# Patient Record
Sex: Male | Born: 1950 | Race: White | Hispanic: No | Marital: Married | Smoking: Former smoker
Health system: Southern US, Community
[De-identification: ages and names within clinical notes are randomized; demographics above are authoritative.]

## PROBLEM LIST (undated history)

## (undated) DIAGNOSIS — Z9989 Dependence on other enabling machines and devices: Secondary | ICD-10-CM

## (undated) DIAGNOSIS — F909 Attention-deficit hyperactivity disorder, unspecified type: Secondary | ICD-10-CM

## (undated) DIAGNOSIS — Z860101 Personal history of adenomatous and serrated colon polyps: Secondary | ICD-10-CM

## (undated) DIAGNOSIS — E785 Hyperlipidemia, unspecified: Secondary | ICD-10-CM

## (undated) DIAGNOSIS — G4733 Obstructive sleep apnea (adult) (pediatric): Secondary | ICD-10-CM

## (undated) DIAGNOSIS — F419 Anxiety disorder, unspecified: Secondary | ICD-10-CM

## (undated) DIAGNOSIS — Z973 Presence of spectacles and contact lenses: Secondary | ICD-10-CM

## (undated) DIAGNOSIS — C679 Malignant neoplasm of bladder, unspecified: Secondary | ICD-10-CM

## (undated) DIAGNOSIS — Z86010 Personal history of colonic polyps: Secondary | ICD-10-CM

## (undated) DIAGNOSIS — H9190 Unspecified hearing loss, unspecified ear: Secondary | ICD-10-CM

## (undated) DIAGNOSIS — N3281 Overactive bladder: Secondary | ICD-10-CM

## (undated) DIAGNOSIS — E039 Hypothyroidism, unspecified: Secondary | ICD-10-CM

## (undated) DIAGNOSIS — I10 Essential (primary) hypertension: Secondary | ICD-10-CM

## (undated) DIAGNOSIS — U071 COVID-19: Secondary | ICD-10-CM

## (undated) DIAGNOSIS — N401 Enlarged prostate with lower urinary tract symptoms: Secondary | ICD-10-CM

## (undated) HISTORY — DX: Hyperlipidemia, unspecified: E78.5

## (undated) HISTORY — PX: KNEE ARTHROSCOPY: SHX127

## (undated) HISTORY — PX: LUMBAR MICRODISCECTOMY: SHX99

## (undated) HISTORY — PX: APPENDECTOMY: SHX54

## (undated) HISTORY — PX: RHINOPLASTY: SUR1284

---

## 1998-10-12 ENCOUNTER — Inpatient Hospital Stay (HOSPITAL_COMMUNITY): Admission: EM | Admit: 1998-10-12 | Discharge: 1998-10-13 | Payer: Self-pay | Admitting: Emergency Medicine

## 1998-10-12 ENCOUNTER — Encounter: Payer: Self-pay | Admitting: Emergency Medicine

## 1999-11-22 ENCOUNTER — Ambulatory Visit (HOSPITAL_COMMUNITY): Admission: RE | Admit: 1999-11-22 | Discharge: 1999-11-22 | Payer: Self-pay | Admitting: Gastroenterology

## 1999-11-26 ENCOUNTER — Ambulatory Visit (HOSPITAL_COMMUNITY): Admission: RE | Admit: 1999-11-26 | Discharge: 1999-11-26 | Payer: Self-pay | Admitting: Gastroenterology

## 1999-12-24 ENCOUNTER — Ambulatory Visit (HOSPITAL_COMMUNITY): Admission: RE | Admit: 1999-12-24 | Discharge: 1999-12-24 | Payer: Self-pay | Admitting: Gastroenterology

## 2000-06-03 ENCOUNTER — Ambulatory Visit (HOSPITAL_COMMUNITY): Admission: RE | Admit: 2000-06-03 | Discharge: 2000-06-04 | Payer: Self-pay | Admitting: Specialist

## 2000-06-03 ENCOUNTER — Encounter: Payer: Self-pay | Admitting: Specialist

## 2000-06-03 ENCOUNTER — Encounter (INDEPENDENT_AMBULATORY_CARE_PROVIDER_SITE_OTHER): Payer: Self-pay | Admitting: *Deleted

## 2001-07-12 ENCOUNTER — Encounter (INDEPENDENT_AMBULATORY_CARE_PROVIDER_SITE_OTHER): Payer: Self-pay

## 2001-07-12 ENCOUNTER — Ambulatory Visit (HOSPITAL_COMMUNITY): Admission: RE | Admit: 2001-07-12 | Discharge: 2001-07-12 | Payer: Self-pay | Admitting: Gastroenterology

## 2002-12-09 ENCOUNTER — Observation Stay (HOSPITAL_COMMUNITY): Admission: RE | Admit: 2002-12-09 | Discharge: 2002-12-10 | Payer: Self-pay | Admitting: Specialist

## 2002-12-09 ENCOUNTER — Encounter (INDEPENDENT_AMBULATORY_CARE_PROVIDER_SITE_OTHER): Payer: Self-pay | Admitting: *Deleted

## 2002-12-09 ENCOUNTER — Encounter: Payer: Self-pay | Admitting: Specialist

## 2009-11-05 ENCOUNTER — Encounter: Admission: RE | Admit: 2009-11-05 | Discharge: 2009-11-05 | Payer: Self-pay | Admitting: General Surgery

## 2009-11-13 ENCOUNTER — Encounter: Admission: RE | Admit: 2009-11-13 | Discharge: 2009-11-13 | Payer: Self-pay | Admitting: General Surgery

## 2011-04-25 NOTE — Op Note (Signed)
. [facility]  Patient:    [name], [name]                      MRN: [id number] Proc. Date: 06/03/00 Adm. Date:  [id number] Disc. Date: [id number] Attending:  Pierce Crane                           Operative Report  PREOPERATIVE DIAGNOSES:  Herniated nucleus pulposus, spinal stenosis, L4-5, left.  POSTOPERATIVE DIAGNOSES:  Herniated nucleus pulposus, spinal stenosis, L4-5, left.  PROCEDURE PERFORMED:  Microdiskectomy, L4-5, left; partial median hemifacetectomy and foraminotomy, L5, left.  ANESTHESIA:  General.  SURGEON:  Javier Docker, M.D.  ASSISTANT:  Georges Lynch. Gioffre, M.D.  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  Patient is noted to have a sacralized lumbar vertebral body and lateral recess stenosis compressing the 5 root.  Patient had persistent positive neural tension sign with diminished sensation in the L5 dermatome and weakness in the EHL.  Operative intervention is indicated for decompression of the lateral recess and diskectomy.  Risks and benefits were discussed including wound bleeding and infection, damage to neurovascular structures, recurrent disk herniation, epidural fibrosis, CSF leakage, and need for fusion in the future, etc.  TECHNIQUE:  With patient supine position, after induction of adequate general anesthesia and Kefzol 1 g IV for antibiotic prophylaxis, patient was placed prone on the Bolton frame.  All bony prominences were well-padded.  Lumbar region was prepped and draped in the usual sterile fashion.  Two 18-gauge spinal needles were utilized to localize the 4-5 interspace; this was confirmed with x-ray.  Incision was made from the spinous process of 4 to 5 through the skin, after infiltration of 0.25% Marcaine with epinephrine. Dorsolumbar fascia was identified and divided in line of the skin incision. Paraspinous muscles were  elevated from the laminae of 4 and 5 on the left with a Cobb elevator.  McCullough retractors were placed.  A Penfield 4 was placed in the interlaminar space of 4-5 and x-ray confirmed the 4-5 space. High-speed bur was then utilized to perform a partial medial hemifacetectomy and laminotomies at L4 and L5.  Ligamentum flavum was removed from the interspace, sparing the neural elements at all times.  Severe lateral recess stenosis was noted with compression of the 5 root.  Operating microscope was draped and brought into the surgical field.  Then the axilla of the nerve root was noted.  Extruded fragment of the HNP was gently mobilized with a nerve hook and then removed with a micropituitary.  Foraminotomy of L5 was performed prior to this.  Next, the nerve was gently mobilized medially and disk space was identified and confirmed with x-ray.  Annulotomy was performed and copious portions of disk material were removed from the interspace.  It was noted to be degenerated.  The nerve root was found to be erythematous and edematous. Disk material was further mobilized with an Epstein.  Endplates were not curetted.  Disk material was removed from the interspace.  There was no residual disk material noted.  Hockey-stick probe was placed beneath the thecal sac, out the foramen of 4 and of 5 and found to have no residual disk material.  There was no residual disk material in the axilla and the nerve root of 5 was free and fully mobile.  There was no CSF leakage or epidural  fibrosis.  The disk space was copiously irrigated.  Next, after strict hemostasis, 2 cc of Fentanyl were placed in the epidural space.  McCullough retractor was then removed and paraspinous muscles inspected and no evidence of active bleeding.  Dorsolumbar fascia was reapproximated with #1 Vicryl in interrupted figure-of-eight sutures, subcutaneous tissue reapproximated with 2-0 Vicryl simple sutures and skin was reapproximated  with 4-0 Prolene subcuticular and wound reinforced with Steri-Strips.  Wound was dressed sterilely.  Patient was placed supine on the hospital bed, extubated without difficulty and transported to the recovery room in satisfactory condition.  Patient tolerated the procedure well with no complications. DD:  06/03/00 TD:  06/04/00 Job: [id number]

## 2011-04-25 NOTE — H&P (Signed)
[name], [name]                           ACCOUNT NO.:  [id number]   MEDICAL RECORD NO.:  [id number]                   PATIENT TYPE:   LOCATION:                                       FACILITY:   PHYSICIAN:  Jene Every, M.D.                 DATE OF BIRTH:  1951/03/25   DATE OF ADMISSION:  12/09/2002  DATE OF DISCHARGE:                                HISTORY & PHYSICAL   CHIEF COMPLAINT:  Left lower extremity pain.   HISTORY:  The patient is a 60 year old male who is status post lumbar  diskectomy about two years.  He has done well until approximately five weeks  ago where he turned wrong while laying on his couch and instantly noted pain  to his left lower extremity.  States this has been persistent.  He has tried  anti-inflammatories without any significant relief.  The patient was treated  with Sterapred dose pack, epidural steroid injection with minimal relief.  MRI of the lumbar spine showed disk herniation at L5-S1; to the left facing  the S1 nerve root with epidural fibrosis.  The patient returned in August  with continued low back pain with lower extremity radicular pain and  numbness.  It is felt at this time the patient has failed conservative  treatment and that he would benefit from a microdiskectomy at L5-S1.  The  risks and benefits of the surgery were discussed with the patient by Jene Every, M.D. and he wishes to proceed.   PAST MEDICAL HISTORY:  1. Significant for hypothyroidism.  2. Gastroesophageal reflux disease.   CURRENT MEDICATIONS:  Synthroid, Concerta, Prilosec.  The patient is to  bring doses at time of admission.   PAST SURGICAL HISTORY:  1. Lumbar decompression.  2. Appendectomy.  3. Multiple rhinoplasties.  4. Tonsillectomy.   ALLERGIES:  No known drug allergies.   SOCIAL HISTORY:  The patient is married.  He denies any tobacco use.  He  does admit to occasional alcohol use.  His wife will be his caregiver  following surgery.   FAMILY HISTORY:  Significant for diabetes.  Both parents had colon cancer.   REVIEW OF SYSTEMS:  GENERAL:  The patient denies any fever, chills, night  sweats, or bleeding tendencies.  CNS:  No blurred or double vision, seizure,  headaches, paralysis.  RESPIRATORY:  No shortness of breath, productive  cough, or hemoptysis.  CARDIOVASCULAR:  No chest pain, angina, or orthopnea.  GASTROINTESTINAL:  No nausea, vomiting, diarrhea, constipation, melena, or  bloody stools.  GENITOURINARY:  No dysuria, hematuria, or discharge.  MUSCULOSKELETAL:  Pertinent to HPI.   PHYSICAL EXAMINATION:  VITAL SIGNS:  Pulse 88, respiratory 16, blood  pressure 146/100.  GENERAL:  Well-developed, well-nourished 60 year old gentleman in mild  distress.  HEENT:  Atraumatic, normocephalic.  Pupils are equal, round, and reactive to  light.  EOMs intact.  NECK:  Supple.  No  lymphadenopathy.  CHEST:  Clear to auscultation bilaterally.  No rhonchi, wheezes, or rales.  HEART:  Regular rate and rhythm without murmurs, gallops, or rubs.  ABDOMEN:  Soft, nontender, nondistended.  Bowel sounds x4.  BREASTS:  Not examined.  Not pertinent to HPI.  GENITOURINARY:  Not examined.  Not pertinent to HPI.  SKIN:  No rashes or lesions are noted.  EXTREMITIES:  The patient has positive straight leg raise on the left with  diminished repetitive plantar flexion on the left, altered sensation in the  S1 dermatome.  Contralateral straight leg raise is negative.   LABORATORIES:  MRI of the lumbar spine shows disk herniation L5-S1 central  and to the left facing the S1 nerve root with epidural fibrosis.   IMPRESSION:  1. Herniated nucleus pulposis L5-S1.  2. Hypothyroidism.  3. Gastroesophageal reflux disease.   PLAN:  The patient will be admitted on [facility] on December 09, 2002 to undergo microdiskectomy at L5-S1.  The patient has received medical  clearance from [facility] A. Evlyn Kanner, M.D. pending EKG.     Roma Schanz, P.A.                   Jene Every, M.D.    CS/MEDQ  D:  12/07/2002  T:  12/07/2002  Job:  [id number]

## 2011-04-25 NOTE — Op Note (Signed)
NAMEKENNIS, WISSMANN                         ACCOUNT NO.:  [id number]   MEDICAL RECORD NO.:  [id number]                   PATIENT TYPE:  AMB   LOCATION:  DAY                                  FACILITY:  [facility]   PHYSICIAN:  Jene Every, M.D.                 DATE OF BIRTH:  July 17, 1951   DATE OF PROCEDURE:  12/09/2002  DATE OF DISCHARGE:                                 OPERATIVE REPORT   PREOPERATIVE DIAGNOSES:  1. Spinal stenosis.  2. Lateral recess stenosis.  3. Herniated nucleus pulposis, L5-S1.   POSTOPERATIVE DIAGNOSES:  1. Spinal stenosis.  2. Lateral recess stenosis.  3. Herniated nucleus pulposis, L5-S1.   PROCEDURES PERFORMED:  1. Lateral recess decompression.  2. Foraminotomy, L5-S1.  3. Microdiskectomy,  L5-S1.   ANESTHESIA:  General.   ASSISTANT:  Roma Schanz, P.A.   BRIEF HISTORY AND INDICATION:  A 60 year old with refractory S1 radicular  pain, MRI indicating compressed S1 nerve root. Operative intervention was  indicated for decompression of the S1 nerve root. He had a sacralized lumbar  vertebral body, hypertrophied facet, shingling with bony stenosis. Operative  intervention was indicated for decompression of the S1 nerve root by  diskectomy, lateral recessed decompression, foraminotomy. The risks and  benefits were discussed including bleeding, infection, damage to  neurovascular structures, CSF leakage, epidural fibrosis, need for repeat  decompression and fusion, etc.   DESCRIPTION OF PROCEDURE:  The patient in supine position after an adequate  level of general anesthesia and 1 gm of Kefzol, he was placed supine on the  Andrew[place] frame, all bony prominences well padded and the lumbar region was  prepped and draped in the usual sterile fashion. Two 18 gauge spinal needles  were utilized to localize the 5-1 interspace confirmed with x-ray. An  incision was made from the spinous process of L5 to S1, the subcutaneous  tissue was dissected,  electrocautery was utilized to achieve hemostasis. The  fascia lata was identified and divided in line with the skin incision. The  paraspinous muscles elevated from the lamina of L5 and S1 and we obtained an  x-ray with a Penfield 4 in the intralaminar space.  It was, however, low at  the S1-S2 interspace.  We then redirected into the L5-S1 interspace as  confirmed with further  radiograph with Penfield 4 in the intralaminar  space.  There was significant shingling noted at the disk space, an  hypertrophied facet, and some facet trophism noted.  Utilizing a high-speed  bur, I contoured the anterior aspect of L5 up to the facet, preserved the  facet; however, detached the ligamentum flavum from the facet edge of S1  utilizing a small straight curette.  The ligamentum flavum was then removed  from the interspace.  I widened the hemilaminotomy with 2 and 3 mm  Kerrisons.  I gently mobilized the thecal sac and the S1 nerve root  medially.  A large extruded HNP was noted extending down into the foramen  and into the axilla of the S1 nerve root compressing it significantly in  this area.  I then mobilized three separate large fragments of disk material  in this area and sent them to pathology for the appropriate analysis.  The  extent of the epidural venous plexus was noted as well, and bipolar  electrocautery was utilized to achieve hemostasis.  I then further mobilized  a tethered nerve root.  Tracing back to the disk space, the nerve root tail  was mobilized medially.  I performed an annulotomy.  A copious portion of  disk material was also removed from the disk space and across the midline of  the disk space.  I performed foraminotomy at L5 as well.  A hockey stick  probe was placed in the foramen at L5 and S1, and found to be widely patent  following the decompression.  I copiously irrigated the disk space in the  wound.  No evidence of active bleeding or CSF leakage.  A hockey stick probe   was placed in the foramen L5 and S1 and found to widely patent.  The nerve  root was found to be erythematous and edematous but freely mobile.  I  checked again the axilla beneath the thecal sac down to the foramen and no  residual disk material was noted.  Again, the wound was copiously irrigated  and all instrumentation was removed.  Inspection revealed no evidence CSF  leakage or active bleeding.  I removed the McCullough retractor, and the  operative microscope, which had been draped and brought onto the surgical  field, was removed.  Thrombin-soaked Gelfoam was placed in the laminotomy  defect.  The dorsal and lumbar fascia were reapproximated with #1 Vicryl  figure-of-eight sutures.  The subcutaneous tissue reapproximated with 2-0  Vicryl subcuticular sutures.  The skin was reapproximated with staples.  The  wound was dressed sterilely.  The patient was placed supine on the hospital  bed and extubated without difficulty and transported to recovery in  satisfactory condition.   The patient tolerated the procedure with no operative complications.                                               Jene Every, M.D.    Cordelia Pen  D:  12/09/2002  T:  12/09/2002  Job:  [id number]

## 2014-01-11 ENCOUNTER — Other Ambulatory Visit: Payer: Self-pay | Admitting: Dermatology

## 2014-01-16 ENCOUNTER — Ambulatory Visit (INDEPENDENT_AMBULATORY_CARE_PROVIDER_SITE_OTHER): Payer: BC Managed Care – PPO | Admitting: Podiatry

## 2014-01-16 ENCOUNTER — Ambulatory Visit (INDEPENDENT_AMBULATORY_CARE_PROVIDER_SITE_OTHER): Payer: BC Managed Care – PPO

## 2014-01-16 ENCOUNTER — Encounter: Payer: Self-pay | Admitting: Podiatry

## 2014-01-16 VITALS — BP 134/74 | HR 77 | Resp 16 | Ht 70.0 in | Wt 208.0 lb

## 2014-01-16 DIAGNOSIS — M722 Plantar fascial fibromatosis: Secondary | ICD-10-CM

## 2014-01-16 MED ORDER — TRIAMCINOLONE ACETONIDE 10 MG/ML IJ SUSP
10.0000 mg | Freq: Once | INTRAMUSCULAR | Status: AC
  Administered 2014-01-16: 10 mg

## 2014-01-16 MED ORDER — DICLOFENAC SODIUM 75 MG PO TBEC: 75.0000 mg | DELAYED_RELEASE_TABLET | Freq: Two times a day (BID) | ORAL | Status: DC

## 2014-01-16 NOTE — Progress Notes (Signed)
   Subjective:    Patient ID: [name], male    DOB: 1951/05/25, 63 y.o.   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 He was standing and moving a lot on the concrete floors. He states that he noticed pain around August 2014 in plantar heel right foot. It aches in the AM and after standing awhile. He has tried stretches with a towel and the "stair stretch" and also rolling foot on frozen gatorade bottle. He says that when he walks his dog that he loses his balance a lot more frequently.   Foot Pain      Review of Systems  All other systems reviewed and are negative.       Objective:   Physical Exam        Assessment & Plan:

## 2014-01-16 NOTE — Patient Instructions (Signed)

## 2014-01-17 NOTE — Progress Notes (Signed)
Subjective:     Patient ID: [name], male   DOB: [birth year], 63 y.o.   MRN: [id number]  Foot Pain   patient presents stating I am having a lot of pain in my right heel that has been present for about 9 months states he has tried rolling his foot on water bottle reduced activity and over-the-counter insoles   Review of Systems  All other systems reviewed and are negative.       Objective:   Physical Exam  Nursing note and vitals reviewed. Constitutional: He is oriented to person, place, and time.  Cardiovascular: Intact distal pulses.   Musculoskeletal: Normal range of motion.  Neurological: He is oriented to person, place, and time.  Skin: Skin is warm.   neurovascular status intact with muscle strength adequate and no equinus condition noted. Pain to palpation right plantar heel at the insertional point of the tendon into the calcaneus with fluid buildup noted around the insertional point    Assessment:     Plantar fasciitis of a significant nature right heel    Plan:     H&P and x-rays reviewed with patient. Injected the right plantar   with 3 mg Kenalog and Xylocaine mixture. Dispensed fascial brace with instructions on usage and placed on Voltaren 75 mg twice a day along with reduced activity for the next several days. Reappoint one week

## 2014-01-23 ENCOUNTER — Ambulatory Visit (INDEPENDENT_AMBULATORY_CARE_PROVIDER_SITE_OTHER): Payer: BC Managed Care – PPO | Admitting: Podiatry

## 2014-01-23 ENCOUNTER — Encounter: Payer: Self-pay | Admitting: Podiatry

## 2014-01-23 VITALS — BP 144/67 | HR 82 | Resp 12

## 2014-01-23 DIAGNOSIS — M722 Plantar fascial fibromatosis: Secondary | ICD-10-CM

## 2014-01-23 NOTE — Progress Notes (Signed)
Subjective:     Patient ID: [name], male   DOB: November 23, 1951, 63 y.o.   MRN: [id number]  HPI patient states my heel is doing much better with minimal discomfort upon outpatient   Review of Systems     Objective:   Physical Exam Neurovascular status intact with minimal discomfort plantar heel and minimal edema noted    Assessment:     Plantar fasciitis improved right with significant nature    Plan:     Advised this patient on anti-inflammatories and supportive shoe. Discharged and less needed

## 2014-07-21 ENCOUNTER — Other Ambulatory Visit: Payer: Self-pay | Admitting: Dermatology

## 2014-07-21 DIAGNOSIS — L821 Other seborrheic keratosis: Secondary | ICD-10-CM | POA: Diagnosis not present

## 2014-07-21 DIAGNOSIS — L851 Acquired keratosis [keratoderma] palmaris et plantaris: Secondary | ICD-10-CM | POA: Diagnosis not present

## 2014-07-21 DIAGNOSIS — D485 Neoplasm of uncertain behavior of skin: Secondary | ICD-10-CM | POA: Diagnosis not present

## 2014-08-29 DIAGNOSIS — E785 Hyperlipidemia, unspecified: Secondary | ICD-10-CM | POA: Diagnosis not present

## 2014-08-29 DIAGNOSIS — F988 Other specified behavioral and emotional disorders with onset usually occurring in childhood and adolescence: Secondary | ICD-10-CM | POA: Diagnosis not present

## 2014-08-29 DIAGNOSIS — I10 Essential (primary) hypertension: Secondary | ICD-10-CM | POA: Diagnosis not present

## 2014-08-29 DIAGNOSIS — Z23 Encounter for immunization: Secondary | ICD-10-CM | POA: Diagnosis not present

## 2014-08-29 DIAGNOSIS — E039 Hypothyroidism, unspecified: Secondary | ICD-10-CM | POA: Diagnosis not present

## 2014-08-29 DIAGNOSIS — Z1331 Encounter for screening for depression: Secondary | ICD-10-CM | POA: Diagnosis not present

## 2014-08-29 DIAGNOSIS — K219 Gastro-esophageal reflux disease without esophagitis: Secondary | ICD-10-CM | POA: Diagnosis not present

## 2014-08-29 DIAGNOSIS — E669 Obesity, unspecified: Secondary | ICD-10-CM | POA: Diagnosis not present

## 2014-11-17 DIAGNOSIS — L821 Other seborrheic keratosis: Secondary | ICD-10-CM | POA: Diagnosis not present

## 2015-01-22 DIAGNOSIS — H2513 Age-related nuclear cataract, bilateral: Secondary | ICD-10-CM | POA: Diagnosis not present

## 2015-01-22 DIAGNOSIS — H43813 Vitreous degeneration, bilateral: Secondary | ICD-10-CM | POA: Diagnosis not present

## 2015-02-09 DIAGNOSIS — Z125 Encounter for screening for malignant neoplasm of prostate: Secondary | ICD-10-CM | POA: Diagnosis not present

## 2015-02-09 DIAGNOSIS — Z008 Encounter for other general examination: Secondary | ICD-10-CM | POA: Diagnosis not present

## 2015-02-09 DIAGNOSIS — E785 Hyperlipidemia, unspecified: Secondary | ICD-10-CM | POA: Diagnosis not present

## 2015-02-09 DIAGNOSIS — I10 Essential (primary) hypertension: Secondary | ICD-10-CM | POA: Diagnosis not present

## 2015-02-09 DIAGNOSIS — E039 Hypothyroidism, unspecified: Secondary | ICD-10-CM | POA: Diagnosis not present

## 2015-02-12 DIAGNOSIS — K760 Fatty (change of) liver, not elsewhere classified: Secondary | ICD-10-CM | POA: Diagnosis not present

## 2015-02-12 DIAGNOSIS — E039 Hypothyroidism, unspecified: Secondary | ICD-10-CM | POA: Diagnosis not present

## 2015-02-12 DIAGNOSIS — Z1389 Encounter for screening for other disorder: Secondary | ICD-10-CM | POA: Diagnosis not present

## 2015-02-12 DIAGNOSIS — R5383 Other fatigue: Secondary | ICD-10-CM | POA: Diagnosis not present

## 2015-02-12 DIAGNOSIS — Z0000 Encounter for general adult medical examination without abnormal findings: Secondary | ICD-10-CM | POA: Diagnosis not present

## 2015-02-12 DIAGNOSIS — R0683 Snoring: Secondary | ICD-10-CM | POA: Diagnosis not present

## 2015-02-12 DIAGNOSIS — K219 Gastro-esophageal reflux disease without esophagitis: Secondary | ICD-10-CM | POA: Diagnosis not present

## 2015-02-12 DIAGNOSIS — I10 Essential (primary) hypertension: Secondary | ICD-10-CM | POA: Diagnosis not present

## 2015-02-12 DIAGNOSIS — Z23 Encounter for immunization: Secondary | ICD-10-CM | POA: Diagnosis not present

## 2015-02-12 DIAGNOSIS — J309 Allergic rhinitis, unspecified: Secondary | ICD-10-CM | POA: Diagnosis not present

## 2015-02-12 DIAGNOSIS — M791 Myalgia: Secondary | ICD-10-CM | POA: Diagnosis not present

## 2015-02-12 DIAGNOSIS — E785 Hyperlipidemia, unspecified: Secondary | ICD-10-CM | POA: Diagnosis not present

## 2015-02-12 DIAGNOSIS — Z6832 Body mass index (BMI) 32.0-32.9, adult: Secondary | ICD-10-CM | POA: Diagnosis not present

## 2015-02-13 DIAGNOSIS — Z1212 Encounter for screening for malignant neoplasm of rectum: Secondary | ICD-10-CM | POA: Diagnosis not present

## 2015-02-28 ENCOUNTER — Other Ambulatory Visit: Payer: Self-pay | Admitting: Dermatology

## 2015-02-28 DIAGNOSIS — D485 Neoplasm of uncertain behavior of skin: Secondary | ICD-10-CM | POA: Diagnosis not present

## 2015-02-28 DIAGNOSIS — L570 Actinic keratosis: Secondary | ICD-10-CM | POA: Diagnosis not present

## 2015-03-08 DIAGNOSIS — D2362 Other benign neoplasm of skin of left upper limb, including shoulder: Secondary | ICD-10-CM | POA: Diagnosis not present

## 2015-03-08 DIAGNOSIS — L570 Actinic keratosis: Secondary | ICD-10-CM | POA: Diagnosis not present

## 2015-03-08 DIAGNOSIS — D2262 Melanocytic nevi of left upper limb, including shoulder: Secondary | ICD-10-CM | POA: Diagnosis not present

## 2015-03-08 DIAGNOSIS — D2371 Other benign neoplasm of skin of right lower limb, including hip: Secondary | ICD-10-CM | POA: Diagnosis not present

## 2015-03-08 DIAGNOSIS — D2271 Melanocytic nevi of right lower limb, including hip: Secondary | ICD-10-CM | POA: Diagnosis not present

## 2015-03-08 DIAGNOSIS — D2272 Melanocytic nevi of left lower limb, including hip: Secondary | ICD-10-CM | POA: Diagnosis not present

## 2015-03-08 DIAGNOSIS — D1801 Hemangioma of skin and subcutaneous tissue: Secondary | ICD-10-CM | POA: Diagnosis not present

## 2015-03-08 DIAGNOSIS — L814 Other melanin hyperpigmentation: Secondary | ICD-10-CM | POA: Diagnosis not present

## 2015-03-08 DIAGNOSIS — D225 Melanocytic nevi of trunk: Secondary | ICD-10-CM | POA: Diagnosis not present

## 2015-03-08 DIAGNOSIS — L821 Other seborrheic keratosis: Secondary | ICD-10-CM | POA: Diagnosis not present

## 2015-04-16 ENCOUNTER — Encounter: Payer: Self-pay | Admitting: Neurology

## 2015-04-16 ENCOUNTER — Ambulatory Visit (INDEPENDENT_AMBULATORY_CARE_PROVIDER_SITE_OTHER): Payer: Medicare Other | Admitting: Neurology

## 2015-04-16 VITALS — BP 118/72 | HR 78 | Resp 18 | Ht 70.0 in | Wt 228.0 lb

## 2015-04-16 DIAGNOSIS — R351 Nocturia: Secondary | ICD-10-CM | POA: Diagnosis not present

## 2015-04-16 DIAGNOSIS — G4733 Obstructive sleep apnea (adult) (pediatric): Secondary | ICD-10-CM | POA: Diagnosis not present

## 2015-04-16 DIAGNOSIS — E669 Obesity, unspecified: Secondary | ICD-10-CM | POA: Diagnosis not present

## 2015-04-16 DIAGNOSIS — G4719 Other hypersomnia: Secondary | ICD-10-CM | POA: Diagnosis not present

## 2015-04-16 NOTE — Progress Notes (Signed)
Subjective:    Patient ID: [name] is a 64 y.o. male.  HPI     Star Age, MD, PhD [facility] [address] [address] [place], [place] [address]  Dear Dr. Virgina Jock,   I saw your patient, [name], upon your kind request in my neurologic clinic today for initial consultation of his sleep disorder, in particular, concern for underlying obstructive sleep apnea. The patient is unaccompanied today. As you know, [name] is a 64 year old right-handed gentleman with an underlying medical history of ADD, reflux disease, hypothyroidism, hyperlipidemia, depression, anxiety, hypertension, diverticulosis, allergic rhinitis, and obesity, who reports snoring and excessive daytime somnolence. I reviewed your office note from 04/13/2015 which you kindly included. He is currently on Concerta 54 mg daily. In addition, he is on atorvastatin, baby aspirin, Synthroid, omega-3, lisinopril-hydrochlorothiazide and Lexapro. I reviewed laboratory test results from 02/09/2015 which you kindly included. Vitamin D was mildly low at 22.1, vitamin B12 was 386, CMP was unremarkable, total cholesterol 181, LDL 111, HDL 43, TSH 2.16, urinalysis negative.   He reports lack of energy, daytime somnolence, not feeling rested when he first wakes up. He tries to keep of sleep and wake schedule, he denies morning headaches, he has been told that not only he snores but he has witnessed pauses in his breathing according to his wife. He drinks a lot of caffeine in the form of coffee which is brewed half-and-half but he can have up to 6 cups in the morning. He usually stops drinking coffee by 11 AM. He does not drink it all day long. He drinks alcohol approximately 1 drink per day. He smoked cigars years ago. He was never a cigarette smoker. He had a tonsillectomy and adenoidectomy at age 73. He has a strong suspicion that his father had severe obstructive sleep apnea. He has retired as a Microbiologist in Information systems manager. He retired 3 years ago. 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 He does not drink sodas regularly. He is a restless sleeper but denies restless leg symptoms and is not aware of any leg twitching at night. He's had weight gain for the past year. Symptoms with his sleep have been more noticeable in the past year. His Epworth sleepiness score is 15 and fatigue scores 47 today.  He had fractured his nose in the distant past. He had rhinoplasty in the 10s. He then had 2 more no surgeries. Recently he tried Afrin for nasal decongestion but is aware that he is not supposed to use it on a regular basis. Flonase did not help.  He has nocturia once or twice per night on average.  His Past Medical History Is Significant For: Past Medical History  Diagnosis Date  . Thyroid disease   . Hyperlipidemia     His Past Surgical History Is Significant For: Past Surgical History  Procedure Laterality Date  . Back surgery    . Nose surgery    . Appendectomy      His Family History Is Significant For: Family History  Problem Relation Age of Onset  . Colon cancer Mother   . Heart murmur Mother   . Colon cancer Father   . Diabetes Father     His Social History Is Significant For: History   Social History  . Marital Status: Married    Spouse Name: N/A  . Number of Children: 1  . Years of Education: BS    Occupational  History  . Retired    Social History Main Topics  . Smoking status: Former Research scientist (life sciences)  . Smokeless tobacco: Not on file     Comment: Quit in 1980' s  . Alcohol Use: 0.0 oz/week    0 Standard drinks or equivalent per week  . Drug Use: No  . Sexual Activity: Not on file   Other Topics Concern  . None   Social History Narrative   Daily consumes coffee, tea, and some soda     His Allergies Are:  No Known Allergies:   His Current Medications Are:  Outpatient Encounter Prescriptions as of 04/16/2015  Medication Sig  .  aspirin 81 MG tablet Take 81 mg by mouth daily.  . ATORVASTATIN CALCIUM PO Take by mouth.  . diclofenac (VOLTAREN) 75 MG EC tablet Take 1 tablet (75 mg total) by mouth 2 (two) times daily.  . Levothyroxine Sodium (SYNTHROID PO) Take by mouth.  . methylphenidate (CONCERTA) 54 MG CR tablet Take 54 mg by mouth every morning.   No facility-administered encounter medications on file as of 04/16/2015.  :  Review of Systems:  Out of a complete 14 point review of systems, all are reviewed and negative with the exception of these symptoms as listed below:   Review of Systems  Constitutional: Positive for fatigue.       Weight gain   Neurological:       Sleepiness, Snoring, witnessed apnea, Hx of 3 nose surgeries, denies trouble falling asleep, wakes up some during the night, daytimes sleepiness, reports taking naps during the day, states he drinks a lot of coffee during the day to stay awake, wakes up in the morning feeling tired.   Psychiatric/Behavioral:       Anxiety, Decreased energy, change in appetite, disinterest in activities    Objective:  Neurologic Exam  Physical Exam Physical Examination:   Filed Vitals:   04/16/15 0948  BP: 118/72  Pulse: 78  Resp: 18    General Examination: The patient is a very pleasant 64 y.o. male in no acute distress. He appears well-developed and well-nourished and well groomed.   HEENT: Normocephalic, atraumatic, pupils are equal, round and reactive to light and accommodation. Funduscopic exam is normal with sharp disc margins noted. Extraocular tracking is good without limitation to gaze excursion or nystagmus noted. Normal smooth pursuit is noted. Hearing is grossly intact. Tympanic membranes are clear bilaterally. Face is symmetric with normal facial animation and normal facial sensation. Speech is clear with no dysarthria noted. There is no hypophonia. There is no lip, neck/head, jaw or voice tremor. Neck is supple with full range of passive and active  motion. There are no carotid bruits on auscultation. Oropharynx exam reveals: mild mouth dryness, good dental hygiene and mild airway crowding, due to redundant soft palate. Mallampati is class II. Tongue protrudes centrally and palate elevates symmetrically. Tonsils are absent. Neck size is 18 inches. He has a Mild overbite. Nasal inspection reveals no significant nasal mucosal bogginess or redness and no septal deviation.   Chest: Clear to auscultation without wheezing, rhonchi or crackles noted.  Heart: S1+S2+0, regular and normal without murmurs, rubs or gallops noted.   Abdomen: Soft, non-tender and non-distended with normal bowel sounds appreciated on auscultation.  Extremities: There is no pitting edema in the distal lower extremities bilaterally. Pedal pulses are intact.  Skin: Warm and dry without trophic changes noted. There are no varicose veins.  Musculoskeletal: exam reveals no obvious joint deformities, tenderness or joint swelling  or erythema.   Neurologically:  Mental status: The patient is awake, alert and oriented in all 4 spheres. His immediate and remote memory, attention, language skills and fund of knowledge are appropriate. There is no evidence of aphasia, agnosia, apraxia or anomia. Speech is clear with normal prosody and enunciation. Thought process is linear. Mood is normal and affect is normal.  Cranial nerves II - XII are as described above under HEENT exam. In addition: shoulder shrug is normal with equal shoulder height noted. Motor exam: Normal bulk, strength and tone is noted. There is no drift, tremor or rebound. Romberg is negative. Reflexes are 2+ throughout. Babinski: Toes are flexor bilaterally. Fine motor skills and coordination: intact with normal finger taps, normal hand movements, normal rapid alternating patting, normal foot taps and normal foot agility.  Cerebellar testing: No dysmetria or intention tremor on finger to nose testing. Heel to shin is  unremarkable bilaterally. There is no truncal or gait ataxia.  Sensory exam: intact to light touch, pinprick, vibration, temperature sense in the upper and lower extremities.  Gait, station and balance: He stands easily. No veering to one side is noted. No leaning to one side is noted. Posture is age-appropriate and stance is narrow based. Gait shows normal stride length and normal pace. No problems turning are noted. He turns en bloc. Tandem walk is unremarkable.  Assessment and Plan:   In summary, EUGEN JEANSONNE is a very pleasant 64 y.o.-year old male with an underlying medical history of ADD, reflux disease, hypothyroidism, hyperlipidemia, depression, anxiety, hypertension, diverticulosis, allergic rhinitis, and obesity, who reports snoring and excessive daytime somnolence, as well as nocturia and witnessed apneas per wife. His history and physical exam are in keeping with obstructive sleep apnea (OSA). I had a long chat with the patient about my findings and the diagnosis of OSA, its prognosis and treatment options. We talked about medical treatments, surgical interventions and non-pharmacological approaches. I explained in particular the risks and ramifications of untreated moderate to severe OSA, especially with respect to developing cardiovascular disease down the Road, including congestive heart failure, difficult to treat hypertension, cardiac arrhythmias, or stroke. Even type 2 diabetes has, in part, been linked to untreated OSA. Symptoms of untreated OSA include daytime sleepiness, memory problems, mood irritability and mood disorder such as depression and anxiety, lack of energy, as well as recurrent headaches, especially morning headaches. We talked about trying to maintain a healthy lifestyle in general, as well as the importance of weight control. I encouraged the patient to eat healthy, exercise daily and keep well hydrated, to keep a scheduled bedtime and wake time routine, to not skip any  meals and eat healthy snacks in between meals. I advised the patient not to drive when feeling sleepy. I recommended the following at this time: sleep study with potential positive airway pressure titration. (We will score hypopneas at 4% and split the sleep study into diagnostic and treatment portion, if the estimated. 2 hour AHI is >15/h).   I explained the sleep test procedure to the patient and also outlined possible surgical and non-surgical treatment options of OSA, including the use of a custom-made dental device (which would require a referral to a specialist dentist or oral surgeon), upper airway surgical options, such as pillar implants, radiofrequency surgery, tongue base surgery, and UPPP (which would involve a referral to an ENT surgeon). Rarely, jaw surgery such as mandibular advancement may be considered.  I also explained the CPAP treatment option to the patient, who  indicated that he would be willing to try CPAP if the need arises. I explained the importance of being compliant with PAP treatment, not only for insurance purposes but primarily to improve His symptoms, and for the patient's long term health benefit, including to reduce His cardiovascular risks. I answered all his questions today and the patient was in agreement. I would like to see him back after the sleep study is completed and encouraged him to call with any interim questions, concerns, problems or updates.   Thank you very much for allowing me to participate in the care of this nice patient. If I can be of any further assistance to you please do not hesitate to call me at [phone].  Sincerely,   Star Age, MD, PhD

## 2015-04-16 NOTE — Patient Instructions (Signed)

## 2015-04-26 ENCOUNTER — Ambulatory Visit (INDEPENDENT_AMBULATORY_CARE_PROVIDER_SITE_OTHER): Payer: Medicare Other | Admitting: Neurology

## 2015-04-26 VITALS — BP 135/79 | HR 91

## 2015-04-26 DIAGNOSIS — G4730 Sleep apnea, unspecified: Secondary | ICD-10-CM

## 2015-04-26 DIAGNOSIS — G4710 Hypersomnia, unspecified: Secondary | ICD-10-CM

## 2015-04-26 DIAGNOSIS — G4733 Obstructive sleep apnea (adult) (pediatric): Secondary | ICD-10-CM | POA: Diagnosis not present

## 2015-04-26 DIAGNOSIS — G4761 Periodic limb movement disorder: Secondary | ICD-10-CM

## 2015-04-26 DIAGNOSIS — G479 Sleep disorder, unspecified: Secondary | ICD-10-CM

## 2015-04-26 NOTE — Sleep Study (Signed)
Please see the scanned sleep study interpretation located in the Procedure tab within the Chart Review section. 

## 2015-05-04 ENCOUNTER — Telehealth: Payer: Self-pay | Admitting: Neurology

## 2015-05-04 DIAGNOSIS — G4733 Obstructive sleep apnea (adult) (pediatric): Secondary | ICD-10-CM

## 2015-05-04 NOTE — Telephone Encounter (Signed)
Patient seen on 04/16/15, split night sleep study 04/26/15.   Beverlee Nims:   Please call and notify patient that the recent sleep study confirmed the diagnosis of severe OSA. He did well with CPAP during the study with improvement of the respiratory events. Therefore, I would like start the patient on CPAP therapy at home by prescribing a machine for home use. I placed the order in the chart. The patient will need a follow up appointment with me in 8 to 10 weeks post set up that has to be scheduled; please go ahead and schedule while you have the patient on the phone and make sure patient understands the importance of keeping this window for the FU appointment, as it is often an insurance requirement and failing to adhere to this may result in losing coverage for sleep apnea treatment. 15 min follow-up should suffice, unless there is a 30 min FU slot available.  Please re-enforce the importance of compliance with treatment and the need for [place] to monitor compliance data - again an insurance requirement and good feedback for the patient as far as how they are doing.  Also remind patient, that any upcoming CPAP machine or mask issues, should be first addressed with the DME company. Please ask if patient has a preference regarding DME company.  Ins: Medicare, BCBS suppl.  Please arrange for CPAP set up at home through a DME company of patient's choice - once you have spoken to the patient - and faxed/routed report to PCP and referring MD (if other than PCP), you can close this encounter, thanks,   Star Age, MD, PhD [facility]

## 2015-05-08 NOTE — Telephone Encounter (Signed)
I spoke to patient. He is aware of results and would like to proceed with CPAP treatment. I will fax report to PCP. And fax referral to [facility] [place]. Patient was able to set f/u appt. I will also send him a letter to remind him to f/u with [place] and the importance of compliance.

## 2015-06-08 ENCOUNTER — Ambulatory Visit: Payer: Medicare Other | Admitting: Neurology

## 2015-06-27 ENCOUNTER — Ambulatory Visit: Payer: Self-pay | Admitting: Neurology

## 2015-07-03 ENCOUNTER — Encounter: Payer: Self-pay | Admitting: Neurology

## 2015-07-03 ENCOUNTER — Ambulatory Visit (INDEPENDENT_AMBULATORY_CARE_PROVIDER_SITE_OTHER): Payer: Medicare Other | Admitting: Neurology

## 2015-07-03 VITALS — BP 124/60 | HR 68 | Resp 16 | Ht 70.0 in | Wt 225.0 lb

## 2015-07-03 DIAGNOSIS — E669 Obesity, unspecified: Secondary | ICD-10-CM

## 2015-07-03 DIAGNOSIS — G4733 Obstructive sleep apnea (adult) (pediatric): Secondary | ICD-10-CM

## 2015-07-03 DIAGNOSIS — Z9989 Dependence on other enabling machines and devices: Principal | ICD-10-CM

## 2015-07-03 NOTE — Patient Instructions (Signed)

## 2015-07-03 NOTE — Progress Notes (Signed)
Subjective:    Patient ID: [name] is a 64 y.o. male.  HPI     Interim history:   [name] is a 64 year old right-handed gentleman with an underlying medical history of ADD, reflux disease, hypothyroidism, hyperlipidemia, depression, anxiety, hypertension, diverticulosis, allergic rhinitis, and obesity, who presents for follow-up consultation of his obstructive sleep apnea, after his recent sleep study. The patient is unaccompanied today. I first met him on 04/16/2015 at the request of his primary care physician, at which time the patient reported snoring and excessive daytime somnolence. I invited him back for sleep study. He had a split-night sleep study on 04/26/2015 underwent over his test results with him in detail today. His baseline sleep efficiency was only 28.4% with a latency to sleep of 218 minutes and wake after sleep onset of 40.5 minutes. He had a markedly elevated arousal index secondary to respiratory events. He had severe PLMS at 93.7 per hour with an associated arousal index of 5.3 per hour. Mild to moderate snoring was noted. Total AHI was 35.7 per hour. Baseline oxygen saturation was 92%, nadir was 86%. He was then titrated on CPAP. Sleep efficiency was improved at 88.6%. Arousal index was improved. REM sleep was increased at 30.5%, indicating REM rebound. Average oxygen saturation was 95%, nadir was 85%. He had moderate PLMS with minimal arousals. CPAP was titrated from 5-13 cm. AHI was 0 per hour at the final pressure. Based on the test results are prescribed CPAP therapy for home use.   Today, 07/03/2015: I reviewed his CPAP compliance data from 06/02/2015 through 07/01/2015 which is a total of 30 days during which time he used his machine every night with the exception of one night, with percent used days greater than 4 hours at 97% indicating excellent compliance with an average usage of 7 hours and 52 minutes, residual AHI at 3.1 per hour, leak low with the 95th  percentile at 10.5 L/m on a pressure of 13 cm with EPR of 3.  Today, 07/03/2015: He reports doing better. He feels that he is sleeping better and has better daytime energy. He is compliant with treatment. He uses a fullface mask for unclear reasons. I prescribed a nasal mask. He does note a leak from time to time and mouth dryness. He is using a mouthwash at night as recommended by his dental hygienist. He has no new complaints.   Previously:   He reports snoring and excessive daytime somnolence. I reviewed your office note from 04/13/2015 which you kindly included. He is currently on Concerta 54 mg daily. In addition, he is on atorvastatin, baby aspirin, Synthroid, omega-3, lisinopril-hydrochlorothiazide and Lexapro. I reviewed laboratory test results from 02/09/2015 which you kindly included. Vitamin D was mildly low at 22.1, vitamin B12 was 386, CMP was unremarkable, total cholesterol 181, LDL 111, HDL 43, TSH 2.16, urinalysis negative.   He reports lack of energy, daytime somnolence, not feeling rested when he first wakes up. He tries to keep of sleep and wake schedule, he denies morning headaches, he has been told that not only he snores but he has witnessed pauses in his breathing according to his wife. He drinks a lot of caffeine in the form of coffee which is brewed half-and-half but he can have up to 6 cups in the morning. He usually stops drinking coffee by 11 AM. He does not drink it all day long. He drinks alcohol approximately 1 drink per day. He smoked cigars years ago. He was never a  cigarette smoker. He had a tonsillectomy and adenoidectomy at age 34. He has a strong suspicion that his father had severe obstructive sleep apnea. 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 He does not drink sodas regularly. He is a restless sleeper but denies restless leg symptoms and is not aware of any leg  twitching at night. He's had weight gain for the past year. Symptoms with his sleep have been more noticeable in the past year. His Epworth sleepiness score is 15 and fatigue scores 47 today.  He had fractured his nose in the distant past. He had rhinoplasty in the 55s. He then had 2 more no surgeries. Recently he tried Afrin for nasal decongestion but is aware that he is not supposed to use it on a regular basis. Flonase did not help.  He has nocturia once or twice per night on average.   His Past Medical History Is Significant For: Past Medical History  Diagnosis Date  . Thyroid disease   . Hyperlipidemia     His Past Surgical History Is Significant For: Past Surgical History  Procedure Laterality Date  . Back surgery    . Nose surgery    . Appendectomy      His Family History Is Significant For: Family History  Problem Relation Age of Onset  . Colon cancer Mother   . Heart murmur Mother   . Colon cancer Father   . Diabetes Father     His Social History Is Significant For: History   Social History  . Marital Status: Married    Spouse Name: N/A  . Number of Children: 1  . Years of Education: BS    Occupational History  . Retired    Social History Main Topics  . Smoking status: Former Research scientist (life sciences)  . Smokeless tobacco: Not on file     Comment: Quit in 1980' s  . Alcohol Use: 0.0 oz/week    0 Standard drinks or equivalent per week  . Drug Use: No  . Sexual Activity: Not on file   Other Topics Concern  . None   Social History Narrative   Daily consumes coffee, tea, and some soda     His Allergies Are:  No Known Allergies:   His Current Medications Are:  Outpatient Encounter Prescriptions as of 07/03/2015  Medication Sig  . aspirin 81 MG tablet Take 81 mg by mouth daily.  . ATORVASTATIN CALCIUM PO Take by mouth.  . cholecalciferol (VITAMIN D) 1000 UNITS tablet Take 2,000 Units by mouth daily.  Marland Kitchen escitalopram (LEXAPRO) 20 MG tablet Take 20 mg by mouth daily.   . Levothyroxine Sodium (SYNTHROID PO) Take 137 mcg by mouth.   Marland Kitchen lisinopril-hydrochlorothiazide (PRINZIDE,ZESTORETIC) 20-12.5 MG per tablet Take 1 tablet by mouth daily.  . methylphenidate (CONCERTA) 54 MG CR tablet Take 54 mg by mouth every morning.  . [DISCONTINUED] diclofenac (VOLTAREN) 75 MG EC tablet Take 1 tablet (75 mg total) by mouth 2 (two) times daily.   No facility-administered encounter medications on file as of 07/03/2015.  :  Review of Systems:  Out of a complete 14 point review of systems, all are reviewed and negative with the exception of these symptoms as listed below:   Review of Systems  All other systems reviewed and are negative.   Objective:  Neurologic Exam  Physical Exam Physical Examination:   Filed Vitals:   07/03/15  1033  BP: 124/60  Pulse: 68  Resp: 16    General Examination: The patient is a very pleasant 64 y.o. male in no acute distress. He appears well-developed and well-nourished and well groomed. He is in good spirits.   HEENT: Normocephalic, atraumatic, pupils are equal, round and reactive to light and accommodation. Funduscopic exam is normal with sharp disc margins noted. Extraocular tracking is good without limitation to gaze excursion or nystagmus noted. Normal smooth pursuit is noted. Hearing is grossly intact. Face is symmetric with normal facial animation and normal facial sensation. Speech is clear with no dysarthria noted. There is no hypophonia. There is no lip, neck/head, jaw or voice tremor. Neck is supple with full range of passive and active motion. There are no carotid bruits on auscultation. Oropharynx exam reveals: mild mouth dryness, good dental hygiene and mild airway crowding, due to redundant soft palate. Mallampati is class II. Tongue protrudes centrally and palate elevates symmetrically. Tonsils are absent. He has a Mild overbite. Nasal inspection reveals no significant nasal mucosal bogginess or redness and no septal  deviation.   Chest: Clear to auscultation without wheezing, rhonchi or crackles noted.  Heart: S1+S2+0, regular and normal without murmurs, rubs or gallops noted.   Abdomen: Soft, non-tender and non-distended with normal bowel sounds appreciated on auscultation.  Extremities: There is no pitting edema in the distal lower extremities bilaterally. Pedal pulses are intact.  Skin: Warm and dry without trophic changes noted. There are no varicose veins.  Musculoskeletal: exam reveals no obvious joint deformities, tenderness or joint swelling or erythema.   Neurologically:  Mental status: The patient is awake, alert and oriented in all 4 spheres. His immediate and remote memory, attention, language skills and fund of knowledge are appropriate. There is no evidence of aphasia, agnosia, apraxia or anomia. Speech is clear with normal prosody and enunciation. Thought process is linear. Mood is normal and affect is normal.  Cranial nerves II - XII are as described above under HEENT exam. In addition: shoulder shrug is normal with equal shoulder height noted. Motor exam: Normal bulk, strength and tone is noted. There is no drift, tremor or rebound. Romberg is negative. Reflexes are 2+ throughout. Babinski: Toes are flexor bilaterally. Fine motor skills and coordination: intact with normal finger taps, normal hand movements, normal rapid alternating patting, normal foot taps and normal foot agility.  Cerebellar testing: No dysmetria or intention tremor on finger to nose testing. Heel to shin is unremarkable bilaterally. There is no truncal or gait ataxia.  Sensory exam: intact to light touch in the upper and lower extremities.  Gait, station and balance: He stands easily. No veering to one side is noted. No leaning to one side is noted. Posture is age-appropriate and stance is narrow based. Gait shows normal stride length and normal pace. No problems turning are noted. He turns en bloc.   Assessment and  Plan:   In summary, [name] is a very pleasant 64 y.o.-year old male with an underlying medical history of ADD, reflux disease, hypothyroidism, hyperlipidemia, depression, anxiety, hypertension, diverticulosis, allergic rhinitis, and obesity, who presents for follow-up consultation of his severe obstructive sleep apnea.he had a split-night sleep study in May 2016 underwent over his test results with him in detail today. We also discussed his compliance data in detail. He is doing well. He reports improved sleep quality and improved daytime somnolence. He's fully compliant with treatment and skip tonight because he stayed over at his farm and had not  planned to stay overnight and did not take his machine with him. Other than that he is doing great with his usage. We will look into his nasal mask versus full facemask confusion. He is not unhappy with a full mask but would like to try a nasal mask if possible. His physical exam is stable. He has an appointment with his primary care physician coming up. We again reviewed the risks and ramifications of untreated OSA, in particular with respect to cardiovascular complications. He is congratulated on his treatment adherence and encouraged to continue using CPAP regularly. Since he is doing well I suggested a 6 month follow-up, sooner if the need arises. I answered all his questions today and he was in agreement. I encouraged him to call with any interim questions, concerns or problems or updates. He is encouraged to sign up for My Chart. I spent 25 minutes in total face-to-face time with the patient, more than 50% of which was spent in counseling and coordination of care, reviewing test results, reviewing medication and discussing or reviewing the diagnosis of OSA, its prognosis and treatment options.

## 2015-08-09 DIAGNOSIS — E559 Vitamin D deficiency, unspecified: Secondary | ICD-10-CM | POA: Diagnosis not present

## 2015-08-09 DIAGNOSIS — E039 Hypothyroidism, unspecified: Secondary | ICD-10-CM | POA: Diagnosis not present

## 2015-08-09 DIAGNOSIS — F900 Attention-deficit hyperactivity disorder, predominantly inattentive type: Secondary | ICD-10-CM | POA: Diagnosis not present

## 2015-08-09 DIAGNOSIS — G4733 Obstructive sleep apnea (adult) (pediatric): Secondary | ICD-10-CM | POA: Diagnosis not present

## 2015-08-09 DIAGNOSIS — E669 Obesity, unspecified: Secondary | ICD-10-CM | POA: Diagnosis not present

## 2015-08-09 DIAGNOSIS — Z6832 Body mass index (BMI) 32.0-32.9, adult: Secondary | ICD-10-CM | POA: Diagnosis not present

## 2015-08-09 DIAGNOSIS — Z23 Encounter for immunization: Secondary | ICD-10-CM | POA: Diagnosis not present

## 2015-08-09 DIAGNOSIS — I10 Essential (primary) hypertension: Secondary | ICD-10-CM | POA: Diagnosis not present

## 2016-01-02 ENCOUNTER — Telehealth: Payer: Self-pay

## 2016-01-02 NOTE — Telephone Encounter (Signed)
I spoke to patient and advised him that Dr. Rexene Alberts is out sick. We were able to reschedule appt.

## 2016-01-03 ENCOUNTER — Ambulatory Visit: Payer: Self-pay | Admitting: Neurology

## 2016-01-24 ENCOUNTER — Encounter: Payer: Self-pay | Admitting: Neurology

## 2016-01-24 ENCOUNTER — Ambulatory Visit (INDEPENDENT_AMBULATORY_CARE_PROVIDER_SITE_OTHER): Payer: Medicare Other | Admitting: Neurology

## 2016-01-24 VITALS — BP 137/74 | HR 83 | Resp 18 | Ht 70.0 in | Wt 235.0 lb

## 2016-01-24 DIAGNOSIS — G4733 Obstructive sleep apnea (adult) (pediatric): Secondary | ICD-10-CM | POA: Diagnosis not present

## 2016-01-24 DIAGNOSIS — E669 Obesity, unspecified: Secondary | ICD-10-CM

## 2016-01-24 DIAGNOSIS — Z9989 Dependence on other enabling machines and devices: Principal | ICD-10-CM

## 2016-01-24 NOTE — Patient Instructions (Signed)

## 2016-01-24 NOTE — Progress Notes (Signed)
Subjective:    Wilkerson ID: [name] is a 65 y.o. male.  HPI    Interim history:   [name] is a 65 year old right-handed gentleman with an underlying medical history of ADD, reflux disease, hypothyroidism, hyperlipidemia, depression, anxiety, hypertension, diverticulosis, allergic rhinitis, and obesity, who presents for follow-up consultation of his obstructive sleep apnea, after his recent sleep study. [name] is unaccompanied today. I last saw him on 07/03/2015, at which time we reviewed his test results. He reported feeling better with respect to his sleep, feeling better rested, having more daytime energy, and better sleep consolidation. He had some mouth dryness. He was using a mouthwash for that. He was compliant with treatment and commended for this as well as advised to continue regularly with CPAP therapy.   Today, 01/24/2016: I reviewed his CPAP compliance data from 12/24/2015 through 01/22/2016 which is a total of 30 days during which time he used his machine 27 days with percent used days greater than 4 hours at 90%, indicating excellent compliance with an average usage of 7 hours and 7 minutes, residual AHI low at 1.5 per hour, leak for Eric most part acceptable with Eric 95th percentile at 24.2 L/m on a pressure of 13 cm with EPR of 3.  Today, 01/24/2016: He reports doing well. Sometimes leak is higher, sometimes wakes up with marks on his face from Eric strap. He has not had any interim problems, some postnasal drip. Weight a little higher, in fact, 2 years ago around this time he was 64. 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  Previously:   I first met him on 04/16/2015 at Eric request of his primary care physician, at which time [name] reported snoring and excessive daytime somnolence. I invited him back for sleep study. He had a split-night sleep study on 04/26/2015 underwent over his test results with him in detail today. His baseline sleep efficiency was only 28.4%  with a latency to sleep of 218 minutes and wake after sleep onset of 40.5 minutes. He had a markedly elevated arousal index secondary to respiratory events. He had severe PLMS at 93.7 per hour with an associated arousal index of 5.3 per hour. Mild to moderate snoring was noted. Total AHI was 35.7 per hour. Baseline oxygen saturation was 92%, nadir was 86%. He was then titrated on CPAP. Sleep efficiency was improved at 88.6%. Arousal index was improved. REM sleep was increased at 30.5%, indicating REM rebound. Average oxygen saturation was 95%, nadir was 85%. He had moderate PLMS with minimal arousals. CPAP was titrated from 5-13 cm. AHI was 0 per hour at Eric final pressure. Based on Eric test results are prescribed CPAP therapy for home use.   I reviewed his CPAP compliance data from 06/02/2015 through 07/01/2015 which is a total of 30 days during which time he used his machine every night with Eric exception of one night, with percent used days greater than 4 hours at 97% indicating excellent compliance with an average usage of 7 hours and 52 minutes, residual AHI at 3.1 per hour, leak low with Eric 95th percentile at 10.5 L/m on a pressure of 13 cm with EPR of 3.  He reports snoring and excessive daytime somnolence. I reviewed your office note from 04/13/2015 which you kindly included. He is currently on Concerta 54 mg daily. In addition, he is on atorvastatin, baby aspirin, Synthroid, omega-3, lisinopril-hydrochlorothiazide and Lexapro. I reviewed laboratory test results from 02/09/2015 which you kindly included. Vitamin D was mildly  low at 22.1, vitamin B12 was 386, CMP was unremarkable, total cholesterol 181, LDL 111, HDL 43, TSH 2.16, urinalysis negative.   He reports lack of energy, daytime somnolence, not feeling rested when he first wakes up. He tries to keep of sleep and wake schedule, he denies morning headaches, he has been told that not only he snores but he has witnessed pauses in his breathing  according to his wife. He drinks a lot of caffeine in Eric form of coffee which is brewed half-and-half but he can have up to 6 cups in Eric morning. He usually stops drinking coffee by 11 AM. He does not drink it all day long. He drinks alcohol approximately 1 drink per day. He smoked cigars years ago. He was never a cigarette smoker. He had a tonsillectomy and adenoidectomy at age 70. He has a strong suspicion that his father had severe obstructive sleep apnea. 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 He does not drink sodas regularly. He is a restless sleeper but denies restless leg symptoms and is not aware of any leg twitching at night. He's had weight gain for Eric past year. Symptoms with his sleep have been more noticeable in Eric past year. His Epworth sleepiness score is 15 and fatigue scores 47 today.  He had fractured his nose in Eric distant past. He had rhinoplasty in Eric 30s. He then had 2 more no surgeries. Recently he tried Afrin for nasal decongestion but is aware that he is not supposed to use it on a regular basis. Flonase did not help.  He has nocturia once or twice per night on average.  His Past Medical History Is Significant For: Past Medical History  Diagnosis Date  . Thyroid disease   . Hyperlipidemia     His Past Surgical History Is Significant For: Past Surgical History  Procedure Laterality Date  . Back surgery    . Nose surgery    . Appendectomy      His Family History Is Significant For: Family History  Problem Relation Age of Onset  . Colon cancer Mother   . Heart murmur Mother   . Colon cancer Father   . Diabetes Father     His Social History Is Significant For: Social History   Social History  . Marital Status: Married    Spouse Name: N/A  . Number of Children: 1  . Years of Education: BS    Occupational History  . Retired    Social History  Main Topics  . Smoking status: Former Research scientist (life sciences)  . Smokeless tobacco: None     Comment: Quit in 1980' s  . Alcohol Use: 0.0 oz/week    0 Standard drinks or equivalent per week  . Drug Use: No  . Sexual Activity: Not Asked   Other Topics Concern  . None   Social History Narrative   Daily consumes coffee, tea, and some soda     His Allergies Are:  No Known Allergies:   His Current Medications Are:  Outpatient Encounter Prescriptions as of 01/24/2016  Medication Sig  . aspirin 81 MG tablet Take 81 mg by mouth daily.  . ATORVASTATIN CALCIUM PO Take by mouth.  . cholecalciferol (VITAMIN D) 1000 UNITS tablet Take 2,000 Units by mouth daily.  Marland Kitchen escitalopram (LEXAPRO) 20 MG tablet Take 20 mg by mouth daily.  . Levothyroxine Sodium (SYNTHROID PO)  Take 137 mcg by mouth.   Marland Kitchen lisinopril-hydrochlorothiazide (PRINZIDE,ZESTORETIC) 20-12.5 MG per tablet Take 1 tablet by mouth daily.  . methylphenidate (CONCERTA) 54 MG CR tablet Take 54 mg by mouth every morning.   No facility-administered encounter medications on file as of 01/24/2016.  :  Review of Systems:  Out of a complete 14 point review of systems, all are reviewed and negative with Eric exception of these symptoms as listed below:   Review of Systems  Neurological:       No new concerns or complaints.      Objective:  Neurologic Exam  Physical Exam Physical Examination:   Filed Vitals:   01/24/16 1059  BP: 137/74  Pulse: 83  Resp: 18   General Examination: [name] is a very pleasant 65 y.o. male in no acute distress. He appears well-developed and well-nourished and well groomed. He is in good spirits.   HEENT: Normocephalic, atraumatic, pupils are equal, round and reactive to light and accommodation. Extraocular tracking is good without limitation to gaze excursion or nystagmus noted. Normal smooth pursuit is noted. Hearing is grossly intact. Face is symmetric with normal facial animation and normal facial sensation.  Speech is clear with no dysarthria noted. There is no hypophonia. There is no lip, neck/head, jaw or voice tremor. Neck is supple with full range of passive and active motion. There are no carotid bruits on auscultation. Oropharynx exam reveals: mild mouth dryness, good dental hygiene and mild airway crowding, due to redundant soft palate. Mallampati is class II. Tongue protrudes centrally and palate elevates symmetrically. Pharynx mildly erythematous. Tonsils are absent. He has a Mild overbite. Nasal inspection reveals no significant nasal mucosal bogginess or redness and no septal deviation.   Chest: Clear to auscultation without wheezing, rhonchi or crackles noted.  Heart: S1+S2+0, regular and normal without murmurs, rubs or gallops noted.   Abdomen: Soft, non-tender and non-distended with normal bowel sounds appreciated on auscultation.  Extremities: There is no pitting edema in Eric distal lower extremities bilaterally. Pedal pulses are intact.  Skin: Warm and dry without trophic changes noted. There are no varicose veins.  Musculoskeletal: exam reveals no obvious joint deformities, tenderness or joint swelling or erythema.   Neurologically:  Mental status: [name] is awake, alert and oriented in all 4 spheres. His immediate and remote memory, attention, language skills and fund of knowledge are appropriate. There is no evidence of aphasia, agnosia, apraxia or anomia. Speech is clear with normal prosody and enunciation. Thought process is linear. Mood is normal and affect is normal.  Cranial nerves II - XII are as described above under HEENT exam. In addition: shoulder shrug is normal with equal shoulder height noted. Motor exam: Normal bulk, strength and tone is noted. There is no drift, tremor or rebound. Romberg is negative. Reflexes are 1-2+ throughout. Fine motor skills and coordination: intact in Eric UEs and LEs.  Cerebellar testing: No dysmetria or intention tremor on finger to nose  testing. Heel to shin is unremarkable bilaterally. There is no truncal or gait ataxia.  Sensory exam: intact to light touch in Eric upper and lower extremities.  Gait, station and balance: He stands easily. No veering to one side is noted. No leaning to one side is noted. Posture is age-appropriate and stance is narrow based. Gait shows normal stride length and normal pace. No problems turning are noted.   Assessment and Plan:   In summary, [name] is a very pleasant 65 year old male with an  underlying medical history of ADD, reflux disease, hypothyroidism, hyperlipidemia, depression, anxiety, hypertension, diverticulosis, allergic rhinitis, and obesity, who presents for follow-up consultation of his severe obstructive sleep apnea, established on CPAP. He had a split-night sleep study in May 2016 and we previously talked about Eric test results in detail and reviewed briefly again today. I also discussed reviewed his most recent compliance data. He is compliant with treatment. He feels improved with respect to his sleep quality and daytime somnolence and sleep consolidation. Physical exam is stable. He has gained weight however. He is encouraged to try to get back on track with regards to weight control. He is aware and motivated. He is very active physically and outdoors a lot. He is happier with a nose mask and leak from Eric mask is borderline at this time. He is advised to not tighten Eric mass too much to avoid pressure indentation on his face and perhaps discomfort at night. At this juncture, he is stable, compliant with treatment, feeling well. I would like to see him back in one year, sooner if needed. I answered all his questions today and he was in agreement. I spent 20 minutes in total face-to-face time with [name], more than 50% of which was spent in counseling and coordination of care, reviewing test results, reviewing medication and discussing or reviewing Eric diagnosis of OSA, its  prognosis and treatment options.

## 2016-02-11 DIAGNOSIS — I10 Essential (primary) hypertension: Secondary | ICD-10-CM | POA: Diagnosis not present

## 2016-02-11 DIAGNOSIS — E038 Other specified hypothyroidism: Secondary | ICD-10-CM | POA: Diagnosis not present

## 2016-02-11 DIAGNOSIS — E559 Vitamin D deficiency, unspecified: Secondary | ICD-10-CM | POA: Diagnosis not present

## 2016-02-11 DIAGNOSIS — E784 Other hyperlipidemia: Secondary | ICD-10-CM | POA: Diagnosis not present

## 2016-02-18 DIAGNOSIS — R739 Hyperglycemia, unspecified: Secondary | ICD-10-CM | POA: Diagnosis not present

## 2016-02-18 DIAGNOSIS — E784 Other hyperlipidemia: Secondary | ICD-10-CM | POA: Diagnosis not present

## 2016-02-18 DIAGNOSIS — Z0000 Encounter for general adult medical examination without abnormal findings: Secondary | ICD-10-CM | POA: Diagnosis not present

## 2016-02-18 DIAGNOSIS — F418 Other specified anxiety disorders: Secondary | ICD-10-CM | POA: Diagnosis not present

## 2016-02-18 DIAGNOSIS — K219 Gastro-esophageal reflux disease without esophagitis: Secondary | ICD-10-CM | POA: Diagnosis not present

## 2016-02-18 DIAGNOSIS — F900 Attention-deficit hyperactivity disorder, predominantly inattentive type: Secondary | ICD-10-CM | POA: Diagnosis not present

## 2016-02-18 DIAGNOSIS — E668 Other obesity: Secondary | ICD-10-CM | POA: Diagnosis not present

## 2016-02-18 DIAGNOSIS — Z6833 Body mass index (BMI) 33.0-33.9, adult: Secondary | ICD-10-CM | POA: Diagnosis not present

## 2016-02-18 DIAGNOSIS — Z1389 Encounter for screening for other disorder: Secondary | ICD-10-CM | POA: Diagnosis not present

## 2016-02-18 DIAGNOSIS — E039 Hypothyroidism, unspecified: Secondary | ICD-10-CM | POA: Diagnosis not present

## 2016-02-18 DIAGNOSIS — I10 Essential (primary) hypertension: Secondary | ICD-10-CM | POA: Diagnosis not present

## 2016-05-15 DIAGNOSIS — L570 Actinic keratosis: Secondary | ICD-10-CM | POA: Diagnosis not present

## 2016-05-15 DIAGNOSIS — L814 Other melanin hyperpigmentation: Secondary | ICD-10-CM | POA: Diagnosis not present

## 2016-05-15 DIAGNOSIS — D1801 Hemangioma of skin and subcutaneous tissue: Secondary | ICD-10-CM | POA: Diagnosis not present

## 2016-05-15 DIAGNOSIS — D2262 Melanocytic nevi of left upper limb, including shoulder: Secondary | ICD-10-CM | POA: Diagnosis not present

## 2016-05-15 DIAGNOSIS — D2362 Other benign neoplasm of skin of left upper limb, including shoulder: Secondary | ICD-10-CM | POA: Diagnosis not present

## 2016-05-15 DIAGNOSIS — L821 Other seborrheic keratosis: Secondary | ICD-10-CM | POA: Diagnosis not present

## 2016-05-15 DIAGNOSIS — D485 Neoplasm of uncertain behavior of skin: Secondary | ICD-10-CM | POA: Diagnosis not present

## 2016-05-15 DIAGNOSIS — D2272 Melanocytic nevi of left lower limb, including hip: Secondary | ICD-10-CM | POA: Diagnosis not present

## 2016-05-15 DIAGNOSIS — D225 Melanocytic nevi of trunk: Secondary | ICD-10-CM | POA: Diagnosis not present

## 2016-05-20 DIAGNOSIS — D128 Benign neoplasm of rectum: Secondary | ICD-10-CM | POA: Diagnosis not present

## 2016-05-20 DIAGNOSIS — Z86010 Personal history of colonic polyps: Secondary | ICD-10-CM | POA: Diagnosis not present

## 2016-05-20 DIAGNOSIS — K5730 Diverticulosis of large intestine without perforation or abscess without bleeding: Secondary | ICD-10-CM | POA: Diagnosis not present

## 2016-05-20 DIAGNOSIS — K621 Rectal polyp: Secondary | ICD-10-CM | POA: Diagnosis not present

## 2016-05-20 DIAGNOSIS — Z800 Family history of malignant neoplasm of digestive organs: Secondary | ICD-10-CM | POA: Diagnosis not present

## 2016-05-20 DIAGNOSIS — D123 Benign neoplasm of transverse colon: Secondary | ICD-10-CM | POA: Diagnosis not present

## 2016-05-20 DIAGNOSIS — Z8371 Family history of colonic polyps: Secondary | ICD-10-CM | POA: Diagnosis not present

## 2016-05-20 DIAGNOSIS — K641 Second degree hemorrhoids: Secondary | ICD-10-CM | POA: Diagnosis not present

## 2016-05-20 DIAGNOSIS — K648 Other hemorrhoids: Secondary | ICD-10-CM | POA: Diagnosis not present

## 2016-06-04 DIAGNOSIS — K641 Second degree hemorrhoids: Secondary | ICD-10-CM | POA: Diagnosis not present

## 2016-06-18 DIAGNOSIS — K641 Second degree hemorrhoids: Secondary | ICD-10-CM | POA: Diagnosis not present

## 2016-06-19 DIAGNOSIS — H43813 Vitreous degeneration, bilateral: Secondary | ICD-10-CM | POA: Diagnosis not present

## 2016-06-19 DIAGNOSIS — H2513 Age-related nuclear cataract, bilateral: Secondary | ICD-10-CM | POA: Diagnosis not present

## 2016-06-19 DIAGNOSIS — H524 Presbyopia: Secondary | ICD-10-CM | POA: Diagnosis not present

## 2016-07-02 DIAGNOSIS — K641 Second degree hemorrhoids: Secondary | ICD-10-CM | POA: Diagnosis not present

## 2016-07-25 DIAGNOSIS — M25561 Pain in right knee: Secondary | ICD-10-CM | POA: Diagnosis not present

## 2016-08-13 DIAGNOSIS — M25561 Pain in right knee: Secondary | ICD-10-CM | POA: Diagnosis not present

## 2016-08-22 DIAGNOSIS — F418 Other specified anxiety disorders: Secondary | ICD-10-CM | POA: Diagnosis not present

## 2016-08-22 DIAGNOSIS — E038 Other specified hypothyroidism: Secondary | ICD-10-CM | POA: Diagnosis not present

## 2016-08-22 DIAGNOSIS — M25561 Pain in right knee: Secondary | ICD-10-CM | POA: Diagnosis not present

## 2016-08-22 DIAGNOSIS — E668 Other obesity: Secondary | ICD-10-CM | POA: Diagnosis not present

## 2016-08-22 DIAGNOSIS — Z23 Encounter for immunization: Secondary | ICD-10-CM | POA: Diagnosis not present

## 2016-08-22 DIAGNOSIS — I10 Essential (primary) hypertension: Secondary | ICD-10-CM | POA: Diagnosis not present

## 2016-08-22 DIAGNOSIS — F900 Attention-deficit hyperactivity disorder, predominantly inattentive type: Secondary | ICD-10-CM | POA: Diagnosis not present

## 2016-08-22 DIAGNOSIS — R7309 Other abnormal glucose: Secondary | ICD-10-CM | POA: Diagnosis not present

## 2016-08-22 DIAGNOSIS — Z6833 Body mass index (BMI) 33.0-33.9, adult: Secondary | ICD-10-CM | POA: Diagnosis not present

## 2016-09-05 DIAGNOSIS — M25561 Pain in right knee: Secondary | ICD-10-CM | POA: Diagnosis not present

## 2016-09-30 DIAGNOSIS — M23331 Other meniscus derangements, other medial meniscus, right knee: Secondary | ICD-10-CM | POA: Diagnosis not present

## 2016-09-30 DIAGNOSIS — M958 Other specified acquired deformities of musculoskeletal system: Secondary | ICD-10-CM | POA: Diagnosis not present

## 2016-09-30 DIAGNOSIS — M23231 Derangement of other medial meniscus due to old tear or injury, right knee: Secondary | ICD-10-CM | POA: Diagnosis not present

## 2016-09-30 DIAGNOSIS — G8918 Other acute postprocedural pain: Secondary | ICD-10-CM | POA: Diagnosis not present

## 2016-09-30 DIAGNOSIS — M23221 Derangement of posterior horn of medial meniscus due to old tear or injury, right knee: Secondary | ICD-10-CM | POA: Diagnosis not present

## 2016-10-07 DIAGNOSIS — Z4789 Encounter for other orthopedic aftercare: Secondary | ICD-10-CM | POA: Diagnosis not present

## 2016-10-28 DIAGNOSIS — Z4789 Encounter for other orthopedic aftercare: Secondary | ICD-10-CM | POA: Diagnosis not present

## 2017-01-21 ENCOUNTER — Telehealth: Payer: Self-pay

## 2017-01-21 NOTE — Telephone Encounter (Signed)
I spoke to pt and reminded him to bring his cpap/chip to the appt with Dr. Rexene Alberts tomorrow. Pt verbalized understanding.

## 2017-01-22 ENCOUNTER — Ambulatory Visit (INDEPENDENT_AMBULATORY_CARE_PROVIDER_SITE_OTHER): Payer: Medicare Other | Admitting: Neurology

## 2017-01-22 ENCOUNTER — Encounter: Payer: Self-pay | Admitting: Neurology

## 2017-01-22 VITALS — BP 117/77 | HR 80 | Resp 20 | Ht 70.0 in | Wt 235.0 lb

## 2017-01-22 DIAGNOSIS — G4733 Obstructive sleep apnea (adult) (pediatric): Secondary | ICD-10-CM

## 2017-01-22 DIAGNOSIS — Z9989 Dependence on other enabling machines and devices: Secondary | ICD-10-CM

## 2017-01-22 DIAGNOSIS — Z9889 Other specified postprocedural states: Secondary | ICD-10-CM | POA: Diagnosis not present

## 2017-01-22 NOTE — Progress Notes (Addendum)
Subjective:    Patient ID: [name] is a 66 y.o. male.  HPI     Interim history:   [name] is a 66 year old right-handed gentleman with an underlying medical history of ADD, reflux disease, hypothyroidism, hyperlipidemia, depression, anxiety, hypertension, diverticulosis, allergic rhinitis, and obesity, who presents for follow-up consultation of his obstructive sleep apnea, on CPAP therapy, for his one-year checkup. The patient is unaccompanied today. I last saw him on 01/24/2016, at which time he reported doing well. He was compliant with CPAP therapy. He was staying busy farming blueberries. His weight had increased a little bit.  Today, 01/22/2017 (all dictated new, as well as above notes, some dictation done in note pad or Word, outside of chart, may appear as copied):   He reports using CPAP regularly. He has noticed that the leak is higher than what it used to be, mask type is same. He had trouble with a machine last year in August and actually got a replacement machine from his DME company. Since then, the machine has not been transmitting data. He follows his CPAP compliance on his cell phone, his AHI appears to be under control but is generally just a little bit higher than what it was last year. Also, his leak is consistently high as I noticed on his cell phone data. Otherwise, he has no new complaints, no recent medical issues or medication changes. He did have problems with his right knee and saw Dr. Maureen Ralphs for this, he needed surgery for meniscus tear. He had arthroscopic surgery in September 2017. He has been doing well after that. He has had some cold symptoms recently. He's willing to take his machine and mask to his DME office to get it looked at. We looked at his filter and it needs changing as well.  Addendum: 01/22/2017 at 17:21: Was able to review his CPAP compliance data from 12/23/2016 through 01/21/2017 which is a total of 30 days, during which time he used his machine  29 days with percent used days greater than 4 hours at 93%, indicating excellent compliance with an average usage of 6 hours and 57 minutes, residual AHI 1.9 per hour, pressure at 13 cm with EPR of 3, leak high with the 95th percentile at 48.9 L/m. We have already alerted his DME company to work with him for better mask seal. He has been tightening his mask quite tightly to the point where I was able to see visible marks on his face today during the visit.  The patient's allergies, current medications, family history, past medical history, past social history, past surgical history and problem list were reviewed and updated as appropriate.   Previously (copied from previous notes for reference):   I last saw him on 07/03/2015, at which time we reviewed his test results. He reported feeling better with respect to his sleep, feeling better rested, having more daytime energy, and better sleep consolidation. He had some mouth dryness. He was using a mouthwash for that. He was compliant with treatment and commended for this as well as advised to continue regularly with CPAP therapy.    I reviewed his CPAP compliance data from 12/24/2015 through 01/22/2016 which is a total of 30 days during which time he used his machine 27 days with percent used days greater than 4 hours at 90%, indicating excellent compliance with an average usage of 7 hours and 7 minutes, residual AHI low at 1.5 per hour, leak for the most part acceptable with the 95th  percentile at 24.2 L/m on a pressure of 13 cm with EPR of 3.   I first met him on 04/16/2015 at the request of his primary care physician, at which time the patient reported snoring and excessive daytime somnolence. I invited him back for sleep study. He had a split-night sleep study on 04/26/2015 underwent over his test results with him in detail today. His baseline sleep efficiency was only 28.4% with a latency to sleep of 218 minutes and wake after sleep onset of 40.5  minutes. He had a markedly elevated arousal index secondary to respiratory events. He had severe PLMS at 93.7 per hour with an associated arousal index of 5.3 per hour. Mild to moderate snoring was noted. Total AHI was 35.7 per hour. Baseline oxygen saturation was 92%, nadir was 86%. He was then titrated on CPAP. Sleep efficiency was improved at 88.6%. Arousal index was improved. REM sleep was increased at 30.5%, indicating REM rebound. Average oxygen saturation was 95%, nadir was 85%. He had moderate PLMS with minimal arousals. CPAP was titrated from 5-13 cm. AHI was 0 per hour at the final pressure. Based on the test results are prescribed CPAP therapy for home use.    I reviewed his CPAP compliance data from 06/02/2015 through 07/01/2015 which is a total of 30 days during which time he used his machine every night with the exception of one night, with percent used days greater than 4 hours at 97% indicating excellent compliance with an average usage of 7 hours and 52 minutes, residual AHI at 3.1 per hour, leak low with the 95th percentile at 10.5 L/m on a pressure of 13 cm with EPR of 3.   He reports snoring and excessive daytime somnolence. I reviewed your office note from 04/13/2015 which you kindly included. He is currently on Concerta 54 mg daily. In addition, he is on atorvastatin, baby aspirin, Synthroid, omega-3, lisinopril-hydrochlorothiazide and Lexapro. I reviewed laboratory test results from 02/09/2015 which you kindly included. Vitamin D was mildly low at 22.1, vitamin B12 was 386, CMP was unremarkable, total cholesterol 181, LDL 111, HDL 43, TSH 2.16, urinalysis negative.    He reports lack of energy, daytime somnolence, not feeling rested when he first wakes up. He tries to keep of sleep and wake schedule, he denies morning headaches, he has been told that not only he snores but he has witnessed pauses in his breathing according to his wife. He drinks a lot of caffeine in the form of coffee  which is brewed half-and-half but he can have up to 6 cups in the morning. He usually stops drinking coffee by 11 AM. He does not drink it all day long. He drinks alcohol approximately 1 drink per day. He smoked cigars years ago. He was never a cigarette smoker. He had a tonsillectomy and adenoidectomy at age 64. He has a strong suspicion that his father had severe obstructive sleep apnea. 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 He does not drink sodas regularly. He is a restless sleeper but denies restless leg symptoms and is not aware of any leg twitching at night. He's had weight gain for the past year. Symptoms with his sleep have been more noticeable in the past year. His Epworth sleepiness score is 15 and fatigue scores 47 today.   He had fractured his nose in the distant past. He had rhinoplasty in  the 70s. He then had 2 more no surgeries. Recently he tried Afrin for nasal decongestion but is aware that he is not supposed to use it on a regular basis. Flonase did not help.   He has nocturia once or twice per night on average.  His Past Medical History Is Significant For: Past Medical History:  Diagnosis Date  . Hyperlipidemia   . Thyroid disease     His Past Surgical History Is Significant For: Past Surgical History:  Procedure Laterality Date  . APPENDECTOMY    . BACK SURGERY    . NOSE SURGERY      His Family History Is Significant For: Family History  Problem Relation Age of Onset  . Colon cancer Mother   . Heart murmur Mother   . Colon cancer Father   . Diabetes Father     His Social History Is Significant For: Social History   Social History  . Marital status: Married    Spouse name: N/A  . Number of children: 1  . Years of education: BS    Occupational History  . Retired    Social History Main Topics  . Smoking status: Former Research scientist (life sciences)  . Smokeless tobacco:  Never Used     Comment: Quit in 1980' s  . Alcohol use 0.0 oz/week  . Drug use: No  . Sexual activity: Not Asked   Other Topics Concern  . None   Social History Narrative   Daily consumes coffee, tea, and some soda     His Allergies Are:  No Known Allergies:   His Current Medications Are:  Outpatient Encounter Prescriptions as of 01/22/2017  Medication Sig  . aspirin 81 MG tablet Take 81 mg by mouth daily.  . ATORVASTATIN CALCIUM PO Take by mouth.  . cholecalciferol (VITAMIN D) 1000 UNITS tablet Take 2,000 Units by mouth daily.  Marland Kitchen escitalopram (LEXAPRO) 20 MG tablet Take 20 mg by mouth daily.  . Levothyroxine Sodium (SYNTHROID PO) Take 137 mcg by mouth.   Marland Kitchen lisinopril-hydrochlorothiazide (PRINZIDE,ZESTORETIC) 20-12.5 MG per tablet Take 1 tablet by mouth daily.  . methylphenidate (CONCERTA) 54 MG CR tablet Take 54 mg by mouth every morning.   No facility-administered encounter medications on file as of 01/22/2017.   :  Review of Systems:  Out of a complete 14 point review of systems, all are reviewed and negative with the exception of these symptoms as listed below:  Review of Systems  Neurological:       Pt presents today to discuss his cpap. Pt is having an issue with his mask seal.     Objective:  Neurologic Exam  Physical Exam Physical Examination:   Vitals:   01/22/17 1034  BP: 117/77  Pulse: 80  Resp: 20   General Examination: The patient is a very pleasant 66 y.o. male in no acute distress. He appears well-developed and well-nourished and well groomed. Good spirits.   HEENT: Normocephalic, atraumatic, pupils are equal, round and reactive to light and accommodation. Funduscopic exam is normal with sharp disc margins noted. Extraocular tracking is good without limitation to gaze excursion or nystagmus noted. Normal smooth pursuit is noted. Hearing is grossly intact. Tympanic membranes are clear bilaterally. Face is symmetric with normal facial animation and  normal facial sensation. Speech is clear with no dysarthria noted. There is no hypophonia. There is no lip, neck/head, jaw or voice tremor. Neck is supple with full range of passive and active motion. There are no carotid bruits on auscultation. Oropharynx  exam reveals: mild mouth dryness, good dental hygiene and mild airway crowding. Mallampati is class II. Tongue protrudes centrally and palate elevates symmetrically. Tonsils are absent.  mild pharyngeal erythema is noted, speech is very slightly nasal and raspy.   Chest: Clear to auscultation without wheezing, rhonchi or crackles noted.  Heart: S1+S2+0, regular and normal without murmurs, rubs or gallops noted.   Abdomen: Soft, non-tender and non-distended with normal bowel sounds appreciated on auscultation.  Extremities: There is trace pitting edema in the distal lower extremities bilaterally. Pedal pulses are intact.  Skin: Warm and dry without trophic changes noted.  Musculoskeletal: exam reveals no obvious joint deformities, tenderness or joint swelling or erythema. Unremarkable scars from arthroscopic knee surgery on the right, reports mild stiffness when standing.   Neurologically:  Mental status: The patient is awake, alert and oriented in all 4 spheres. His immediate and remote memory, attention, language skills and fund of knowledge are appropriate. There is no evidence of aphasia, agnosia, apraxia or anomia. Speech is clear with normal prosody and enunciation. Thought process is linear. Mood is normal and affect is normal.  Cranial nerves II - XII are as described above under HEENT exam. In addition: shoulder shrug is normal with equal shoulder height noted. Motor exam: Normal bulk, strength and tone is noted. There is no drift, tremor or rebound. Reflexes are 1-2+ throughout. Fine motor skills and coordination: intact.   Cerebellar testing: No dysmetria or intention tremor. There is no truncal or gait ataxia.  Sensory exam: intact to  light touch in the upper and lower extremities.  Gait, station and balance: He stands easily. No veering to one side is noted. No leaning to one side is noted. Posture is age-appropriate and stance is narrow based. Gait shows normal stride length and normal pace. No problems turning are noted. No limp noted.                Assessment and Plan:  In summary, [name] is a very pleasant 66 y.o.-year old male withAn underlying medical history of ADD, reflux disease, hyperlipidemia, hypothyroidism, hypertension, diverticulosis, allergic rhinitis, obesity, depression and anxiety, who presents for follow-up consultation of his OSA. Split-night sleep study testing in May 2016 to determine severe obstructive sleep apnea and he has since then established CPAP treatment with full compliance. He presents for his 1 year checkup and indicates full compliance, he has had intermittent issues with his machine which needed to be exchanged and we do not have compliance data available for review but I reviewed his recent compliance on his phone. He has an increase in his leak despite using the same kind of nasal mask. He is advised to take his machine to his DME provider and they will look at the machine and also his mask seal. He needs to change the filter in the machine as well and will do so at home as he has replacements at home. I will see him back routinely in one year, sooner as needed. I answered all his questions today and he was in agreement. I spent 20 minutes in total face-to-face time with the patient, more than 50% of which was spent in counseling and coordination of care, reviewing test results, reviewing medication and discussing or reviewing the diagnosis of OSA, its prognosis and treatment options. Pertinent laboratory and imaging test results that were available during this visit with the patient were reviewed by me and considered in my medical decision making (see chart for details).

## 2017-01-22 NOTE — Patient Instructions (Addendum)
Please continue using your CPAP regularly. While your insurance requires that you use CPAP at least 4 hours each night on 70% of the nights, I recommend, that you not skip any nights and use it throughout the night if you can. Getting used to CPAP and staying with the treatment long term does take time and patience and discipline. Untreated obstructive sleep apnea when it is moderate to severe can have an adverse impact on cardiovascular health and raise her risk for heart disease, arrhythmias, hypertension, congestive heart failure, stroke and diabetes. Untreated obstructive sleep apnea causes sleep disruption, nonrestorative sleep, and sleep deprivation. This can have an impact on your day to day functioning and cause daytime sleepiness and impairment of cognitive function, memory loss, mood disturbance, and problems focussing. Using CPAP regularly can improve these symptoms.  Follow up in one year.   Please stop by [facility] and they will look at your machine and why it is not transmitting the data, they will have to look at your mask seal, which appears to be unusually high leak. You need to change the filter too.

## 2017-02-10 DIAGNOSIS — M25561 Pain in right knee: Secondary | ICD-10-CM | POA: Diagnosis not present

## 2017-02-10 DIAGNOSIS — S83231D Complex tear of medial meniscus, current injury, right knee, subsequent encounter: Secondary | ICD-10-CM | POA: Diagnosis not present

## 2017-02-10 DIAGNOSIS — M1711 Unilateral primary osteoarthritis, right knee: Secondary | ICD-10-CM | POA: Diagnosis not present

## 2017-02-12 DIAGNOSIS — R7309 Other abnormal glucose: Secondary | ICD-10-CM | POA: Diagnosis not present

## 2017-02-12 DIAGNOSIS — E038 Other specified hypothyroidism: Secondary | ICD-10-CM | POA: Diagnosis not present

## 2017-02-12 DIAGNOSIS — Z125 Encounter for screening for malignant neoplasm of prostate: Secondary | ICD-10-CM | POA: Diagnosis not present

## 2017-02-12 DIAGNOSIS — E784 Other hyperlipidemia: Secondary | ICD-10-CM | POA: Diagnosis not present

## 2017-02-12 DIAGNOSIS — I10 Essential (primary) hypertension: Secondary | ICD-10-CM | POA: Diagnosis not present

## 2017-02-12 DIAGNOSIS — E559 Vitamin D deficiency, unspecified: Secondary | ICD-10-CM | POA: Diagnosis not present

## 2017-02-19 DIAGNOSIS — I10 Essential (primary) hypertension: Secondary | ICD-10-CM | POA: Diagnosis not present

## 2017-02-19 DIAGNOSIS — R5383 Other fatigue: Secondary | ICD-10-CM | POA: Diagnosis not present

## 2017-02-19 DIAGNOSIS — E784 Other hyperlipidemia: Secondary | ICD-10-CM | POA: Diagnosis not present

## 2017-02-19 DIAGNOSIS — G4733 Obstructive sleep apnea (adult) (pediatric): Secondary | ICD-10-CM | POA: Diagnosis not present

## 2017-02-19 DIAGNOSIS — M25561 Pain in right knee: Secondary | ICD-10-CM | POA: Diagnosis not present

## 2017-02-19 DIAGNOSIS — E668 Other obesity: Secondary | ICD-10-CM | POA: Diagnosis not present

## 2017-02-19 DIAGNOSIS — Z0000 Encounter for general adult medical examination without abnormal findings: Secondary | ICD-10-CM | POA: Diagnosis not present

## 2017-02-19 DIAGNOSIS — F419 Anxiety disorder, unspecified: Secondary | ICD-10-CM | POA: Diagnosis not present

## 2017-02-19 DIAGNOSIS — Z1389 Encounter for screening for other disorder: Secondary | ICD-10-CM | POA: Diagnosis not present

## 2017-02-19 DIAGNOSIS — E038 Other specified hypothyroidism: Secondary | ICD-10-CM | POA: Diagnosis not present

## 2017-02-19 DIAGNOSIS — Z6833 Body mass index (BMI) 33.0-33.9, adult: Secondary | ICD-10-CM | POA: Diagnosis not present

## 2017-02-19 DIAGNOSIS — R7309 Other abnormal glucose: Secondary | ICD-10-CM | POA: Diagnosis not present

## 2017-02-19 DIAGNOSIS — R972 Elevated prostate specific antigen [PSA]: Secondary | ICD-10-CM | POA: Diagnosis not present

## 2017-03-27 DIAGNOSIS — M25561 Pain in right knee: Secondary | ICD-10-CM | POA: Diagnosis not present

## 2017-03-27 DIAGNOSIS — M1711 Unilateral primary osteoarthritis, right knee: Secondary | ICD-10-CM | POA: Diagnosis not present

## 2017-04-08 DIAGNOSIS — M1711 Unilateral primary osteoarthritis, right knee: Secondary | ICD-10-CM | POA: Diagnosis not present

## 2017-04-23 DIAGNOSIS — N401 Enlarged prostate with lower urinary tract symptoms: Secondary | ICD-10-CM | POA: Diagnosis not present

## 2017-04-23 DIAGNOSIS — R972 Elevated prostate specific antigen [PSA]: Secondary | ICD-10-CM | POA: Diagnosis not present

## 2017-04-23 DIAGNOSIS — R3915 Urgency of urination: Secondary | ICD-10-CM | POA: Diagnosis not present

## 2017-04-23 DIAGNOSIS — R3121 Asymptomatic microscopic hematuria: Secondary | ICD-10-CM | POA: Diagnosis not present

## 2017-05-01 DIAGNOSIS — M1711 Unilateral primary osteoarthritis, right knee: Secondary | ICD-10-CM | POA: Diagnosis not present

## 2017-05-08 DIAGNOSIS — R3121 Asymptomatic microscopic hematuria: Secondary | ICD-10-CM | POA: Diagnosis not present

## 2017-05-08 DIAGNOSIS — K5730 Diverticulosis of large intestine without perforation or abscess without bleeding: Secondary | ICD-10-CM | POA: Diagnosis not present

## 2017-05-13 ENCOUNTER — Other Ambulatory Visit: Payer: Self-pay | Admitting: Urology

## 2017-05-14 DIAGNOSIS — Z01818 Encounter for other preprocedural examination: Secondary | ICD-10-CM | POA: Diagnosis not present

## 2017-05-14 DIAGNOSIS — Z0189 Encounter for other specified special examinations: Secondary | ICD-10-CM | POA: Diagnosis not present

## 2017-05-14 DIAGNOSIS — I10 Essential (primary) hypertension: Secondary | ICD-10-CM | POA: Diagnosis not present

## 2017-05-14 DIAGNOSIS — E7800 Pure hypercholesterolemia, unspecified: Secondary | ICD-10-CM | POA: Diagnosis not present

## 2017-05-15 DIAGNOSIS — R935 Abnormal findings on diagnostic imaging of other abdominal regions, including retroperitoneum: Secondary | ICD-10-CM | POA: Diagnosis not present

## 2017-05-15 DIAGNOSIS — Z01810 Encounter for preprocedural cardiovascular examination: Secondary | ICD-10-CM | POA: Diagnosis not present

## 2017-05-18 ENCOUNTER — Other Ambulatory Visit: Payer: Self-pay | Admitting: Urology

## 2017-05-20 ENCOUNTER — Encounter (HOSPITAL_BASED_OUTPATIENT_CLINIC_OR_DEPARTMENT_OTHER): Payer: Self-pay | Admitting: *Deleted

## 2017-05-20 NOTE — Progress Notes (Signed)
Pt instructed npo pmn 6/20 x synthroid, lexapro, concerta, atorvastatin w sip of water.  To [facility] 6/21 @ 0600.  Needs istat on arrival.  LOVN, ekg, cardiac studies requested from Dr. Irven Shelling office.

## 2017-05-27 NOTE — H&P (Signed)
CC: Elevated PSA--New Patient/Consult  HPI: [name] is a 66 year-old male patient who was referred by Dr. Precious Reel, MD who is here evaluation of his PSA.  He is a patient of Dr. Shon Baton seen in office consultation today. His last PSA was performed 02/26/2017. The last PSA value was 4.033. The patient states he does not take 5 alpha reductase inhibitor medication. He has not undergone a prior prostate biopsy. The patient complains of lower urinary tract symptom(s) that include frequency, urgency, hesitancy, weak stream, intermittency, straining, and nocturia. 0-1 times per night. He does not have a history of prostatitis.   [name] is a 66 yo WM who is sent by Dr. Virgina Jock for microhematuria with 10-15 RBC's and a PSA of 4.033 with 12.4% f/t ratio which was up from 2.697 last year. he has some frequency and urgency with occasional nocturia and some obstructive symptoms. He has no family history of prostate cancer or other voiding complaints. His UA today is clear.      ALLERGIES: None   MEDICATIONS: Atorvastatin Calcium 40 mg tablet  Escitalopram Oxalate 20 mg tablet  Lisinopril-Hydrochlorothiazide 20 mg-12.5 mg tablet  Methylphenidate Er 54 mg tablet, extended release 24 hr  Synthroid 137 mcg tablet     GU PSH: None   NON-GU PSH: Appendectomy (open) Back surgery Knee Arthroscopy/surgery, MCL    GU PMH: None   NON-GU PMH: Anxiety GERD Hypothyroidism Sleep Apnea    FAMILY HISTORY: 1 son - Other Colon Cancer - Mother, Father heart failure - Father, Mother   SOCIAL HISTORY: Marital Status: Married Current Smoking Status: Patient does not smoke anymore. Has not smoked since 04/07/1997.   Tobacco Use Assessment Completed: Used Tobacco in last 30 days? Drinks 3 caffeinated drinks per day.     Notes: Retired   REVIEW OF SYSTEMS:    GU Review Male:   Patient reports frequent urination, hard to postpone urination, get up at night to urinate, stream starts and stops, trouble  starting your stream, have to strain to urinate , and erection problems. Patient denies burning/ pain with urination, leakage of urine, and penile pain.  Gastrointestinal (Upper):   Patient denies nausea, vomiting, and indigestion/ heartburn.  Gastrointestinal (Lower):   Patient denies diarrhea and constipation.  Constitutional:   Patient denies fever, night sweats, weight loss, and fatigue.  Skin:   Patient denies skin rash/ lesion and itching.  Eyes:   Patient denies blurred vision and double vision.  Ears/ Nose/ Throat:   Patient denies sore throat and sinus problems.  Hematologic/Lymphatic:   Patient denies swollen glands and easy bruising.  Cardiovascular:   Patient denies leg swelling and chest pains.  Respiratory:   Patient denies cough and shortness of breath.  Endocrine:   Patient denies excessive thirst.  Musculoskeletal:   Patient reports joint pain. Patient denies back pain.  Neurological:   Patient denies headaches and dizziness.  Psychologic:   Patient denies depression and anxiety.   VITAL SIGNS:      04/23/2017 03:26 PM  Weight 222 lb / 100.7 kg  Height 70 in / 177.8 cm  BP 119/77 mmHg  Heart Rate 72 /min  BMI 31.9 kg/m   GU PHYSICAL EXAMINATION:    Anus and Perineum: No hemorrhoids. No anal stenosis. No rectal fissure, no anal fissure. No edema, no dimple, no perineal tenderness, no anal tenderness.  Scrotum: No lesions. No edema. No cysts. No warts.  Epididymides: Right: no spermatocele, no masses, no cysts, no tenderness,  no induration, no enlargement. Left: no spermatocele, no masses, no cysts, no tenderness, no induration, no enlargement.  Testes: No tenderness, no swelling, no enlargement left testes. No tenderness, no swelling, no enlargement right testes. Normal location left testes. Normal location right testes. No mass, no cyst, no varicocele, no hydrocele left testes. No mass, no cyst, no varicocele, no hydrocele right testes.  Urethral Meatus: Normal size. No  lesion, no wart, no discharge, no polyp. Normal location.  Penis: Circumcised, no warts, no cracks. No dorsal Peyronie's plaques, no left corporal Peyronie's plaques, no right corporal Peyronie's plaques, no scarring, no warts. No balanitis, no meatal stenosis.  Prostate: Prostate 2 + size. Left lobe normal consistency, right lobe normal consistency. Symmetrical lobes. No prostate nodule. Left lobe no tenderness, right lobe no tenderness.   Seminal Vesicles: Nonpalpable.  Sphincter Tone: Normal sphincter. No rectal tenderness. No rectal mass.    MULTI-SYSTEM PHYSICAL EXAMINATION:    Constitutional: Well-nourished. No physical deformities. Normally developed. Good grooming.  Neck: Neck symmetrical, not swollen. Normal tracheal position.  Respiratory: No labored breathing, no use of accessory muscles. CTA  Cardiovascular: Normal temperature,RRR without murmur  Lymphatic: No enlargement of neck, axillae, groin.  Skin: No paleness, no jaundice, no cyanosis. No lesion, no ulcer, no rash.  Neurologic / Psychiatric: Oriented to time, oriented to place, oriented to person. No depression, no anxiety, no agitation.  Gastrointestinal: Obese abdomen. No mass, no tenderness, no rigidity.   Musculoskeletal: Normal gait and station of head and neck.     PAST DATA REVIEWED:  Source Of History:  Patient  Lab Test Review:   PSA  Records Review:   Previous Doctor Records  Urine Test Review:   Urinalysis  Notes:                     records and labs from Dr. Virgina Jock reviewed. His Cr was 1.2.    PROCEDURES:          Urinalysis - 81003 Dipstick Dipstick Cont'd  Color: Yellow Bilirubin: Neg  Appearance: Clear Ketones: Neg  Specific Gravity: 1.015 Blood: Neg  pH: <=5.0 Protein: Neg  Glucose: Neg Urobilinogen: 0.2    Nitrites: Neg    Leukocyte Esterase: Neg    Notes:      ASSESSMENT:      ICD-10 Details  1 GU:   Elevated PSA - R97.20 His PSA is up over 4 with a low f/t ratio. I am going to repeat the  PSA and if it remains elevated, I will have him return for a prostate [place] and biopsy. I have reviewed the risks of bleeding, infection and voiding difficulty.   2   Microscopic hematuria - R31.21 He had 5-10 RBC's on UA and I will have him return for a CT hematuria study and cystoscopy.   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 He wiill have an IPSS, flowrate and PVR as part of his evaluation.   4   Urinary Urgency - R39.15    PLAN:           Orders Labs PSA with Reflex, BUN/Creatinine          Schedule X-Rays: C.T. Hematuria With and Without I.V. Contrast - prior to f/u for cysto and prostate biopsy  Return Visit/Planned Activity: ASAP - Office Visit, Cystoscopy, PVR, Flow Rate  Procedure: Unspecified Date at [facility] - [phone] - TRUSP/BX (Prostate Needle Biopsy) - [place], [id number], [id number], [id number], [email or web address] Notes:  at f/u  Procedure: Unspecified Date at [facility] [phone] - Flexible Cystoscopy (Cystoscopy) - [address] Notes: at f/u   Procedure: Unspecified Date at [facility] - [phone] - PVR Ultrasound (Bladder Scan / Residual Urine) - [address] Notes: at f/u  Procedure: Unspecified Date at [facility] 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 It is in close proximity to the right ureteral orifice. He has significant vascular calcifications as well. I gave [name] his results and will get him set up for a TURBT with possible epirubicin instillation. I reviewed the risks of bleeding, infection, bladder wall and ureteral injury, strictures, need for a stent and secondary procedures, chemical cystitis, thrombotic events and anesthetic complications.

## 2017-05-28 ENCOUNTER — Encounter (HOSPITAL_BASED_OUTPATIENT_CLINIC_OR_DEPARTMENT_OTHER)
Admission: RE | Disposition: A | Discharge status: Home / Self Care | Payer: Self-pay | Source: Ambulatory Visit | Attending: Urology

## 2017-05-28 ENCOUNTER — Encounter (HOSPITAL_BASED_OUTPATIENT_CLINIC_OR_DEPARTMENT_OTHER): Payer: Self-pay

## 2017-05-28 ENCOUNTER — Ambulatory Visit (HOSPITAL_BASED_OUTPATIENT_CLINIC_OR_DEPARTMENT_OTHER): Payer: Medicare Other | Admitting: Anesthesiology

## 2017-05-28 ENCOUNTER — Ambulatory Visit (HOSPITAL_BASED_OUTPATIENT_CLINIC_OR_DEPARTMENT_OTHER)
Admission: RE | Admit: 2017-05-28 | Discharge: 2017-05-28 | Disposition: A | Discharge status: Home / Self Care | Payer: Medicare Other | Source: Ambulatory Visit | Attending: Urology | Admitting: Urology

## 2017-05-28 DIAGNOSIS — G4730 Sleep apnea, unspecified: Secondary | ICD-10-CM | POA: Diagnosis not present

## 2017-05-28 DIAGNOSIS — I10 Essential (primary) hypertension: Secondary | ICD-10-CM | POA: Insufficient documentation

## 2017-05-28 DIAGNOSIS — Z87891 Personal history of nicotine dependence: Secondary | ICD-10-CM | POA: Diagnosis not present

## 2017-05-28 DIAGNOSIS — Z79899 Other long term (current) drug therapy: Secondary | ICD-10-CM | POA: Diagnosis not present

## 2017-05-28 DIAGNOSIS — E039 Hypothyroidism, unspecified: Secondary | ICD-10-CM | POA: Insufficient documentation

## 2017-05-28 DIAGNOSIS — R3915 Urgency of urination: Secondary | ICD-10-CM | POA: Insufficient documentation

## 2017-05-28 DIAGNOSIS — R351 Nocturia: Secondary | ICD-10-CM | POA: Insufficient documentation

## 2017-05-28 DIAGNOSIS — N401 Enlarged prostate with lower urinary tract symptoms: Secondary | ICD-10-CM | POA: Insufficient documentation

## 2017-05-28 DIAGNOSIS — C672 Malignant neoplasm of lateral wall of bladder: Secondary | ICD-10-CM | POA: Diagnosis not present

## 2017-05-28 DIAGNOSIS — D494 Neoplasm of unspecified behavior of bladder: Secondary | ICD-10-CM | POA: Diagnosis not present

## 2017-05-28 DIAGNOSIS — C675 Malignant neoplasm of bladder neck: Secondary | ICD-10-CM | POA: Diagnosis not present

## 2017-05-28 DIAGNOSIS — R350 Frequency of micturition: Secondary | ICD-10-CM | POA: Diagnosis not present

## 2017-05-28 DIAGNOSIS — R3129 Other microscopic hematuria: Secondary | ICD-10-CM | POA: Diagnosis not present

## 2017-05-28 HISTORY — PX: TRANSURETHRAL RESECTION OF BLADDER TUMOR: SHX2575

## 2017-05-28 HISTORY — PX: CYSTOSCOPY WITH STENT PLACEMENT: SHX5790

## 2017-05-28 HISTORY — DX: Presence of spectacles and contact lenses: Z97.3

## 2017-05-28 HISTORY — DX: Unspecified hearing loss, unspecified ear: H91.90

## 2017-05-28 HISTORY — DX: Hypothyroidism, unspecified: E03.9

## 2017-05-28 LAB — POCT I-STAT 4, (NA,K, GLUC, HGB,HCT)
Glucose, Bld: 111 mg/dL — ABNORMAL HIGH (ref 65–99)
HEMATOCRIT: 42 % (ref 39.0–52.0)
HEMOGLOBIN: 14.3 g/dL (ref 13.0–17.0)
POTASSIUM: 3.8 mmol/L (ref 3.5–5.1)
SODIUM: 144 mmol/L (ref 135–145)

## 2017-05-28 SURGERY — TURBT (TRANSURETHRAL RESECTION OF BLADDER TUMOR)
Anesthesia: General | Laterality: Right

## 2017-05-28 MED ORDER — FENTANYL CITRATE (PF) 100 MCG/2ML IJ SOLN
INTRAMUSCULAR | Status: DC | PRN
Start: 2017-05-28 — End: 2017-05-28
  Administered 2017-05-28 (×4): 50 ug via INTRAVENOUS

## 2017-05-28 MED ORDER — SODIUM CHLORIDE 0.9 % IV SOLN
250.0000 mL | INTRAVENOUS | Status: DC | PRN
  Filled 2017-05-28: qty 250

## 2017-05-28 MED ORDER — METOCLOPRAMIDE HCL 5 MG/ML IJ SOLN
10.0000 mg | Freq: Once | INTRAMUSCULAR | Status: DC | PRN
  Filled 2017-05-28: qty 2

## 2017-05-28 MED ORDER — CEFAZOLIN SODIUM-DEXTROSE 2-4 GM/100ML-% IV SOLN
2.0000 g | INTRAVENOUS | Status: AC
  Administered 2017-05-28: 2 g via INTRAVENOUS
  Filled 2017-05-28: qty 100

## 2017-05-28 MED ORDER — ACETAMINOPHEN 325 MG PO TABS
650.0000 mg | ORAL_TABLET | ORAL | Status: DC | PRN
  Filled 2017-05-28: qty 2

## 2017-05-28 MED ORDER — FENTANYL CITRATE (PF) 100 MCG/2ML IJ SOLN
25.0000 ug | INTRAMUSCULAR | Status: DC | PRN
  Filled 2017-05-28: qty 1

## 2017-05-28 MED ORDER — DEXAMETHASONE SODIUM PHOSPHATE 4 MG/ML IJ SOLN
INTRAMUSCULAR | Status: DC | PRN
  Administered 2017-05-28: 10 mg via INTRAVENOUS

## 2017-05-28 MED ORDER — SODIUM CHLORIDE 0.9% FLUSH
3.0000 mL | INTRAVENOUS | Status: DC | PRN
  Filled 2017-05-28: qty 3

## 2017-05-28 MED ORDER — CEFAZOLIN SODIUM-DEXTROSE 2-4 GM/100ML-% IV SOLN
INTRAVENOUS | Status: AC
  Filled 2017-05-28: qty 100

## 2017-05-28 MED ORDER — MORPHINE SULFATE (PF) 2 MG/ML IV SOLN
2.0000 mg | INTRAVENOUS | Status: DC | PRN
  Filled 2017-05-28: qty 1

## 2017-05-28 MED ORDER — SUGAMMADEX SODIUM 200 MG/2ML IV SOLN
INTRAVENOUS | Status: AC
  Filled 2017-05-28: qty 2

## 2017-05-28 MED ORDER — LIDOCAINE 2% (20 MG/ML) 5 ML SYRINGE
INTRAMUSCULAR | Status: AC
  Filled 2017-05-28: qty 5

## 2017-05-28 MED ORDER — PROPOFOL 10 MG/ML IV BOLUS
INTRAVENOUS | Status: DC | PRN
  Administered 2017-05-28: 200 mg via INTRAVENOUS

## 2017-05-28 MED ORDER — MIDAZOLAM HCL 5 MG/5ML IJ SOLN
INTRAMUSCULAR | Status: DC | PRN
  Administered 2017-05-28: 2 mg via INTRAVENOUS

## 2017-05-28 MED ORDER — MIDAZOLAM HCL 2 MG/2ML IJ SOLN
INTRAMUSCULAR | Status: AC
  Filled 2017-05-28: qty 2

## 2017-05-28 MED ORDER — SODIUM CHLORIDE 0.9% FLUSH
3.0000 mL | Freq: Two times a day (BID) | INTRAVENOUS | Status: DC
  Filled 2017-05-28: qty 3

## 2017-05-28 MED ORDER — SUGAMMADEX SODIUM 200 MG/2ML IV SOLN
INTRAVENOUS | Status: DC | PRN
  Administered 2017-05-28: 200 mg via INTRAVENOUS

## 2017-05-28 MED ORDER — FENTANYL CITRATE (PF) 100 MCG/2ML IJ SOLN
INTRAMUSCULAR | Status: AC
  Filled 2017-05-28: qty 2

## 2017-05-28 MED ORDER — ROCURONIUM BROMIDE 50 MG/5ML IV SOSY
PREFILLED_SYRINGE | INTRAVENOUS | Status: AC
  Filled 2017-05-28: qty 5

## 2017-05-28 MED ORDER — PROPOFOL 10 MG/ML IV BOLUS
INTRAVENOUS | Status: AC
  Filled 2017-05-28: qty 40

## 2017-05-28 MED ORDER — MEPERIDINE HCL 25 MG/ML IJ SOLN
6.2500 mg | INTRAMUSCULAR | Status: DC | PRN
  Filled 2017-05-28: qty 1

## 2017-05-28 MED ORDER — LACTATED RINGERS IV SOLN
INTRAVENOUS | Status: DC
  Administered 2017-05-28 (×2): via INTRAVENOUS
  Filled 2017-05-28: qty 1000

## 2017-05-28 MED ORDER — ACETAMINOPHEN 10 MG/ML IV SOLN
INTRAVENOUS | Status: DC | PRN
  Administered 2017-05-28: 1000 mg via INTRAVENOUS

## 2017-05-28 MED ORDER — DEXAMETHASONE SODIUM PHOSPHATE 10 MG/ML IJ SOLN
INTRAMUSCULAR | Status: AC
  Filled 2017-05-28: qty 1

## 2017-05-28 MED ORDER — ACETAMINOPHEN 650 MG RE SUPP
650.0000 mg | RECTAL | Status: DC | PRN
  Filled 2017-05-28: qty 1

## 2017-05-28 MED ORDER — ONDANSETRON HCL 4 MG/2ML IJ SOLN
INTRAMUSCULAR | Status: DC | PRN
  Administered 2017-05-28: 4 mg via INTRAVENOUS

## 2017-05-28 MED ORDER — ONDANSETRON HCL 4 MG/2ML IJ SOLN
INTRAMUSCULAR | Status: AC
  Filled 2017-05-28: qty 2

## 2017-05-28 MED ORDER — OXYCODONE HCL 5 MG PO TABS
5.0000 mg | ORAL_TABLET | ORAL | Status: DC | PRN
  Filled 2017-05-28: qty 2

## 2017-05-28 MED ORDER — SUCCINYLCHOLINE CHLORIDE 20 MG/ML IJ SOLN
INTRAMUSCULAR | Status: DC | PRN
  Administered 2017-05-28: 120 mg via INTRAVENOUS

## 2017-05-28 MED ORDER — HYDROCODONE-ACETAMINOPHEN 5-325 MG PO TABS: 1.0000 | ORAL_TABLET | Freq: Four times a day (QID) | ORAL | 0 refills | Status: DC | PRN

## 2017-05-28 MED ORDER — SODIUM CHLORIDE 0.9 % IR SOLN
Status: DC | PRN
  Administered 2017-05-28: 18000 mL

## 2017-05-28 MED ORDER — LIDOCAINE HCL (CARDIAC) 20 MG/ML IV SOLN
INTRAVENOUS | Status: DC | PRN
  Administered 2017-05-28: 100 mg via INTRAVENOUS

## 2017-05-28 MED ORDER — ROCURONIUM BROMIDE 100 MG/10ML IV SOLN
INTRAVENOUS | Status: DC | PRN
  Administered 2017-05-28: 30 mg via INTRAVENOUS
  Administered 2017-05-28: 10 mg via INTRAVENOUS

## 2017-05-28 SURGICAL SUPPLY — 35 items
BAG DRAIN URO-CYSTO SKYTR STRL (DRAIN) ×4 IMPLANT
BAG DRN ANRFLXCHMBR STRAP LEK (BAG)
BAG DRN UROCATH (DRAIN) ×2
BAG URINE DRAINAGE (UROLOGICAL SUPPLIES) IMPLANT
BAG URINE LEG 19OZ MD ST LTX (BAG) IMPLANT
CATH FOLEY 2WAY SLVR  5CC 20FR (CATHETERS)
CATH FOLEY 2WAY SLVR  5CC 22FR (CATHETERS) ×2
CATH FOLEY 2WAY SLVR 5CC 20FR (CATHETERS) IMPLANT
CATH FOLEY 2WAY SLVR 5CC 22FR (CATHETERS) IMPLANT
CATH URET 5FR 28IN CONE TIP (BALLOONS)
CATH URET 5FR 28IN OPEN ENDED (CATHETERS) ×4 IMPLANT
CATH URET 5FR 70CM CONE TIP (BALLOONS) IMPLANT
CLOTH BEACON ORANGE TIMEOUT ST (SAFETY) ×4 IMPLANT
ELECT REM PT RETURN 9FT ADLT (ELECTROSURGICAL)
ELECTRODE REM PT RTRN 9FT ADLT (ELECTROSURGICAL) ×2 IMPLANT
GLOVE SURG SS PI 8.0 STRL IVOR (GLOVE) ×4 IMPLANT
GOWN STRL REUS W/ TWL LRG LVL3 (GOWN DISPOSABLE) ×2 IMPLANT
GOWN STRL REUS W/ TWL XL LVL3 (GOWN DISPOSABLE) ×2 IMPLANT
GOWN STRL REUS W/TWL LRG LVL3 (GOWN DISPOSABLE) ×4
GOWN STRL REUS W/TWL XL LVL3 (GOWN DISPOSABLE) ×4
GUIDEWIRE 0.038 PTFE COATED (WIRE) IMPLANT
GUIDEWIRE ANG ZIPWIRE 038X150 (WIRE) IMPLANT
GUIDEWIRE STR DUAL SENSOR (WIRE) ×4 IMPLANT
HOLDER FOLEY CATH W/STRAP (MISCELLANEOUS) IMPLANT
IV NS IRRIG 3000ML ARTHROMATIC (IV SOLUTION) ×10 IMPLANT
KIT RM TURNOVER CYSTO AR (KITS) ×4 IMPLANT
MANIFOLD NEPTUNE II (INSTRUMENTS) ×2 IMPLANT
NS IRRIG 500ML POUR BTL (IV SOLUTION) IMPLANT
PACK CYSTO (CUSTOM PROCEDURE TRAY) ×4 IMPLANT
PLUG CATH AND CAP STER (CATHETERS) IMPLANT
SET ASPIRATION TUBING (TUBING) IMPLANT
SYRINGE IRR TOOMEY STRL 70CC (SYRINGE) IMPLANT
TUBE CONNECTING 12'X1/4 (SUCTIONS)
TUBE CONNECTING 12X1/4 (SUCTIONS) IMPLANT
WATER STERILE IRR 500ML POUR (IV SOLUTION) ×4 IMPLANT

## 2017-05-28 NOTE — Transfer of Care (Signed)
Immediate Anesthesia Transfer of Care Note  Patient: [name]  Procedure(s) Performed: Procedure(s) (LRB): TRANSURETHRAL RESECTION OF BLADDER TUMOR (TURBT) (N/A) CYSTOSCOPY (Right)  Patient Location: PACU  Anesthesia Type: General  Level of Consciousness: awake, sedated, patient cooperative and responds to stimulation  Airway & Oxygen Therapy: Patient Spontanous Breathing and Patient connected to [place] O2  Post-op Assessment: Report given to PACU RN, Post -op Vital signs reviewed and stable and Patient moving all extremities  Post vital signs: Reviewed and stable  Complications: No apparent anesthesia complications

## 2017-05-28 NOTE — Brief Op Note (Signed)
05/28/2017  8:30 AM  PATIENT:  [name]  66 y.o. male  PRE-OPERATIVE DIAGNOSIS:  BLADDER TUMOR RIGHT LATERAL WALL POST-OPERATIVE DIAGNOSIS:  BLADDER TUMOR OVERLAPPING SITES  PROCEDURE:  Procedure(s): TRANSURETHRAL RESECTION OF BLADDER TUMOR (TURBT) (N/A) >5cm OVERLAPPING SITES CYSTOSCOPY (Right)  SURGEON:  Surgeon(s) and Role:    * Irine Seal, MD - Primary  PHYSICIAN ASSISTANT:   ASSISTANTS: none   ANESTHESIA:   general  EBL:  Total I/O In: 1000 [I.V.:1000] Out: -   BLOOD ADMINISTERED:none  DRAINS: Urinary Catheter (Foley)   LOCAL MEDICATIONS USED:  NONE  SPECIMEN:  Source of Specimen:  Bladder tumor chips from right lateral wall tumor and trigone tumor  DISPOSITION OF SPECIMEN:  PATHOLOGY  COUNTS:  YES  TOURNIQUET:  * No tourniquets in log *  DICTATION: .Other Dictation: Dictation Number [id number]  PLAN OF CARE: Discharge to home after PACU  PATIENT DISPOSITION:  PACU - hemodynamically stable.   Delay start of Pharmacological VTE agent (>24hrs) due to surgical blood loss or risk of bleeding: yes

## 2017-05-28 NOTE — Discharge Instructions (Addendum)
Transurethral Resection of Bladder Tumor, Care After Refer to this sheet in the next few weeks. These instructions provide you with information about caring for yourself after your procedure. Your health care provider may also give you more specific instructions. Your treatment has been planned according to current medical practices, but problems sometimes occur. Call your health care provider if you have any problems or questions after your procedure. What can I expect after the procedure? After the procedure, it is common to have:  A small amount of blood in your urine for up to 2 weeks.  Soreness or mild discomfort from your catheter. After your catheter is removed, you may have mild soreness, especially when urinating.  Pain in your lower abdomen.  Follow these instructions at home:  Medicines  Take over-the-counter and prescription medicines only as told by your health care provider.  Do not drive or operate heavy machinery while taking prescription pain medicine.  Do not drive for 24 hours if you received a sedative.  If you were prescribed antibiotic medicine, take it as told by your health care provider. Do not stop taking the antibiotic even if you start to feel better. Activity  Return to your normal activities as told by your health care provider. Ask your health care provider what activities are safe for you.  Do not lift anything that is heavier than 10 lb (4.5 kg) for as long as told by your health care provider.  Avoid intense physical activity for as long as told by your health care provider.  Walk at least one time every day. This helps to prevent blood clots. You may increase your physical activity gradually as you start to feel better. General instructions  Do not drink alcohol for as long as told by your health care provider. This is especially important if you are taking prescription pain medicines.  Do not take baths, swim, or use a hot tub until your health  care provider approves.  If you have a catheter, follow instructions from your health care provider about caring for your catheter and your drainage bag.  Drink enough fluid to keep your urine clear or pale yellow.  Wear compression stockings as told by your health care provider. These stockings help to prevent blood clots and reduce swelling in your legs.  Keep all follow-up visits as told by your health care provider. This is important. Contact a health care provider if:  You have pain that gets worse or does not improve with medicine.  You have blood in your urine for more than 2 weeks.  You have cloudy or bad-smelling urine.  You become constipated. Signs of constipation may include having: ? Fewer than three bowel movements in a week. ? Difficulty having a bowel movement. ? Stools that are dry, hard, or larger than normal.  You have a fever. Get help right away if:  You have: ? Severe pain. ? Bright-red blood in your urine. ? Blood clots in your urine. ? A lot of blood in your urine.  Your catheter has been removed and you are not able to urinate.  You have a catheter in place and the catheter is not draining urine.  You may remove the catheter in the morning.    Please hold the aspirin for a week.   This information is not intended to replace advice given to you by your health care provider. Make sure you discuss any questions you have with your health care provider. Document Released: 11/05/2015 Document  Revised: 07/27/2016 Document Reviewed: 08/16/2015 Elsevier Interactive Patient Education  [employer].   Bladder Cancer Bladder cancer is an abnormal growth of tissue in the bladder. The bladder is the balloon-like sac in the pelvis. It collects and stores urine that comes from the kidneys through the ureters. The bladder wall is made of layers. If cancer spreads into these layers and through the wall of the bladder, it becomes more difficult to treat. What  are the causes? The cause of this condition is not known. What increases the risk? The following factors may make you more likely to develop this condition:  Smoking.  Workplace risks (occupational exposures), such as rubber, leather, textile, dyes, chemicals, and paint.  Being white.  Your age. Most people with bladder cancer are over the age of 25.  Being male.  Having chronic bladder inflammation.  Having a personal history of bladder cancer.  Having a family history of bladder cancer (heredity).  Having had chemotherapy or radiation therapy to the pelvis.  Having been exposed to arsenic.  What are the signs or symptoms? Initial symptoms of this condition include:  Blood in the urine.  Painful urination.  Frequent bladder or urine infections.  Increase in urgency and frequency of urination.  Advanced symptoms of this condition include:  Not being able to urinate.  Low back pain on one side.  Loss of appetite.  Weight loss.  Fatigue.  Swelling in the feet.  Bone pain.  How is this diagnosed? This condition is diagnosed based on your medical history, a physical exam, urine tests, lab tests, imaging tests, and your symptoms. You may also have other tests or procedures done, such as:  A narrow tube being inserted into your bladder through your urethra (cystoscopy) in order to view the lining of your bladder for tumors.  A biopsy to sample the tumor to see if cancer is present.  If cancer is present, it will then be staged to determine its severity and extent. Staging is an assessment of:  The size of the tumor.  Whether the cancer has spread.  Where the cancer has spread.  It is important to know how deeply into the bladder wall cancer has grown and whether cancer has spread to any other parts of your body. Staging may require blood tests or imaging tests, such as a CT scan, MRI, bone scan, or chest X-ray. How is this treated? Based on the stage of  cancer, one treatment or a combination of treatments may be recommended. The most common forms of treatment are:  Surgery to remove the cancer. Procedures that may be done include transurethral resection and cystectomy.  Radiation therapy. This is high-energy X-rays or other particles. This is often used in combination with chemotherapy.  Chemotherapy. During this treatment, medicines are used to kill cancer cells.  Immunotherapy. This uses medicines to help your own immune system destroy cancer cells.  Follow these instructions at home:  Take over-the-counter and prescription medicines only as told by your health care provider.  Maintain a healthy diet. Some of your treatments might affect your appetite.  Consider joining a support group. This may help you learn to cope with the stress of having bladder cancer.  Tell your cancer care team if you develop side effects. They may be able to recommend ways to relieve them.  Keep all follow-up visits as told by your health care provider. This is important. Where to find more information:  American Cancer Society: www.cancer.[place]  [facility] ([place]): www.cancer.gov Contact a health care provider if:  You have symptoms of a urinary tract infection. These include: ? Fever. ? Chills. ? Weakness. ? Muscle aches. ? Abdominal pain. ? Frequent and intense urge to urinate. ? Burning feeling in the bladder or urethra during urination. Get help right away if:  There is blood in your urine.  You cannot urinate.  You have severe pain or other symptoms that do not go away. Summary  Bladder cancer is an abnormal growth of tissue in the bladder.  This condition is diagnosed based on your medical history, a physical exam, urine tests, lab tests, imaging tests, and your symptoms.  Based on the stage of cancer, surgery, chemotherapy, or a combination of treatments may be recommended.  Consider joining a support group. This may  help you learn to cope with the stress of having bladder cancer. This information is not intended to replace advice given to you by your health care provider. Make sure you discuss any questions you have with your health care provider. Document Released: 11/27/2003 Document Revised: 10/28/2016 Document Reviewed: 10/28/2016 Elsevier Interactive Patient Education  2017 [place] Calmette-Guerin Live, BCG intravesical solution What is this medicine? BACILLUS CALMETTE-GUERIN LIVE, BCG (ba SIL [place] KAL met gay RAYN) is a bacteria solution. This medicine stimulates the immune system to ward off cancer cells. It is used to treat bladder cancer. This medicine may be used for other purposes; ask your health care provider or pharmacist if you have questions. COMMON BRAND NAME(S): Theracys, TICE BCG What should I tell my health care provider before I take this medicine? They need to know if you have any of these conditions: -aneurysm -blood in the urine -bladder biopsy within 2 weeks -fever or infection -immune system problems -leukemia -lymphoma -myasthenia gravis -need organ transplant -prosthetic device like arterial graft, artificial joint, prosthetic heart valve -recent or ongoing radiation therapy -tuberculosis -an unusual or allergic reaction to Bacillus Calmette-Guerin Live, BCG, latex, other medicines, foods, dyes, or preservatives -pregnant or trying to get pregnant -breast-feeding How should I use this medicine? This drug is given as a catheter infusion into the bladder. It is administered in a hospital or clinic by a specially trained health care professional. Dennis Bast will be given directions to follow before the treatment. Follow your doctor's directions carefully. Try to hold this medicine in your bladder for 2 hours after treatment. Talk to your pediatrician regarding the use of this medicine in children. Special care may be needed. Overdosage: If you think you have taken  too much of this medicine contact a poison control center or emergency room at once. NOTE: This medicine is only for you. Do not share this medicine with others. What if I miss a dose? It is important not to miss your dose. Call your doctor or health care professional if you are unable to keep an appointment. What may interact with this medicine? -antibiotics -medicines to suppress your immune system like chemotherapy agents or corticosteroids -medicine to treat tuberculosis This list may not describe all possible interactions. Give your health care provider a list of all the medicines, herbs, non-prescription drugs, or dietary supplements you use. Also tell them if you smoke, drink alcohol, or use illegal drugs. Some items may interact with your medicine. What should I watch for while using this medicine? Visit your doctor for checks on your progress. This drug may make you feel generally unwell. Contact your doctor if your symptoms last more than 2  days or if they get worse. Call your doctor right away if you have a severe or unusual symptom. Infection can be spread to others through contact with this medicine. To prevent the spread of infection follow your doctor's directions carefully after treatment. For the first 6 hours after each treatment, sit down on the toilet to urinate. After urinating, add 2 cups of bleach to the toilet bowl and let set for 15 minutes before flushing. Wash your hands before and after using the restroom. Drink water or other fluids as directed after treatment with this medicine. Do not become pregnant while taking this medicine. Women should inform their doctor if they wish to become pregnant or think they might be pregnant. There is a potential for serious side effects to an unborn child. Talk to your health care professional or pharmacist for more information. Do not breast-feed an infant while taking this medicine. What side effects may I notice from receiving this  medicine? Side effects that you should report to your doctor or health care professional as soon as possible: -allergic reactions like skin rash, itching or hives, swelling of the face, lips, or tongue -signs of infection - fever or chills, cough, sore throat, pain or difficulty passing urine -signs of decreased red blood cells - unusually weak or tired, fainting spells, lightheadedness -blood in urine -breathing problems -cough -eye pain, redness -flu-like symptoms -joint pain -bladder-area pain for more than 2 days after treatment -trouble passing urine or change in the amount of urine -vomiting -yellowing of the eyes or skin Side effects that usually do not require medical attention (report to your doctor or health care professional if they continue or are bothersome): -bladder spasm -burning when passing urine within 2 days of treatment -feel need to pass urine often or wake up at night to pass urine -loss of appetite This list may not describe all possible side effects. Call your doctor for medical advice about side effects. You may report side effects to FDA at 1-800-FDA-1088. Where should I keep my medicine? This drug is given in a hospital or clinic and will not be stored at home. NOTE: This sheet is a summary. It may not cover all possible information. If you have questions about this medicine, talk to your doctor, pharmacist, or health care provider.  2018 Elsevier/Gold Standard (2015-12-27 10:33:35)  Post Anesthesia Home Care Instructions  Activity: Get plenty of rest for the remainder of the day. A responsible individual must stay with you for 24 hours following the procedure.  For the next 24 hours, DO NOT: -Drive a car -Paediatric nurse -Drink alcoholic beverages -Take any medication unless instructed by your physician -Make any legal decisions or sign important papers.  Meals: Start with liquid foods such as gelatin or soup. Progress to regular foods as  tolerated. Avoid greasy, spicy, heavy foods. If nausea and/or vomiting occur, drink only clear liquids until the nausea and/or vomiting subsides. Call your physician if vomiting continues.  Special Instructions/Symptoms: Your throat may feel dry or sore from the anesthesia or the breathing tube placed in your throat during surgery. If this causes discomfort, gargle with warm salt water. The discomfort should disappear within 24 hours.  If you had a scopolamine patch placed behind your ear for the management of post- operative nausea and/or vomiting:  1. The medication in the patch is effective for 72 hours, after which it should be removed.  Wrap patch in a tissue and discard in the trash. Wash hands thoroughly with  soap and water. 2. You may remove the patch earlier than 72 hours if you experience unpleasant side effects which may include dry mouth, dizziness or visual disturbances. 3. Avoid touching the patch. Wash your hands with soap and water after contact with the patch.

## 2017-05-28 NOTE — Interval H&P Note (Signed)
History and Physical Interval Note:  05/28/2017 7:15 AM  [name]  has presented today for surgery, with the diagnosis of BLADDER TUMOR  The various methods of treatment have been discussed with the patient and family. After consideration of risks, benefits and other options for treatment, the patient has consented to  Procedure(s): TRANSURETHRAL RESECTION OF BLADDER TUMOR (TURBT) POSSIBLE EPIRUBICIN INSTILLATION (N/A) CYSTOSCOPY WITH STENT PLACEMENT (Right) as a surgical intervention .  The patient's history has been reviewed, patient examined, no change in status, stable for surgery.  I have reviewed the patient's chart and labs.  Questions were answered to the patient's satisfaction.     Gianina Olinde J

## 2017-05-28 NOTE — Op Note (Signed)
NAMEJORI, THRALL               ACCOUNT NO.:  [id number]  MEDICAL RECORD NO.:  [id number]  LOCATION:                                 FACILITY:  PHYSICIAN:  Marshall Cork. Jeffie Pollock, M.D.         DATE OF BIRTH:  DATE OF PROCEDURE:  05/28/2017 DATE OF DISCHARGE:                              OPERATIVE REPORT   PROCEDURE:  Cystoscopy with transurethral resection of large bladder tumor and overlapping sites.  PREOPERATIVE DIAGNOSIS:  Right lateral wall bladder tumor.  POSTOPERATIVE DIAGNOSIS:  Right lateral wall bladder tumor with multifocal tumors with overlapping sites.  SURGEON:  Marshall Cork. Jeffie Pollock, M.D.  ANESTHESIA:  General.  SPECIMEN:  Tumor chips from right lateral wall and trigone.  DRAINS:  A 22-French Foley catheter.  BLOOD LOSS:  Minimal.  COMPLICATIONS:  None.  INDICATIONS:  [name] is a 66 year old white male who originally presented with irritative voiding symptoms.  He was found to have hematuria and a CT scan was performed which revealed a filling defect on the right lateral wall, consistent with urothelial carcinoma.  It appeared to be adjacent to the right ureteral orifice.  DESCRIPTION OF PROCEDURE:  He was taken to the operating room today where a general anesthetic was induced.  He was given Ancef.  He was placed in lithotomy position.  His perineum and genitalia were prepped with Betadine solution and he was draped in usual sterile fashion.  Cystoscopy was performed using the 23-French scope with a 30 and 70- degree lenses.  Examination revealed a normal urethra.  The external sphincter was intact.  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  Examination of the bladder revealed multifocal bladder tumors involving the trigone.  The bladder neck from approximately 4 o'clock to 11 o'clock extending onto the right lateral wall with a large 3 cm tumor in the center of a carpet of tumors. Additionally, there was an area approximately 2  x 3 cm on the left lateral wall with diffuse papillary fronds and an approximately 2 x 3 cm tumor in the trigone and bladder neck area that was more prominent.  The left ureteral orifice was identified and was free of surrounding tumor.  The right ureteral orifice had a few fronds that were up to the meatus.  This was only identified after resection of the large right lateral wall tumor.  There was moderate trabeculation noted.  No diverticula were noted.  Once initial inspection was performed, a 28-French continuous flow resectoscope sheath was placed with the aid of a visual obturator.  This was then fitted with an Beatrix Fetters handle with a 30-degree lens and bipolar loop.  Saline was used as the irrigant.  Initially, the large right lateral wall tumor was resected down to muscular fibers.  His pelvic floor/prostate was somewhat stiff, so it was a little bit difficult to angulate over to the sidewall, but I was able to completely resect this tumor down into the muscular fibers.  The patient received paralytics, so there was no obturator reflex or perforation.  Once the tumor had been resected and hemostasis was achieved, the chips were evacuated and collected for pathology.  I then  resected the more bulky tumor at the trigone and bladder neck and retrieved these chips to send for pathology.  The left lateral wall tumor, I then initially attempted resection, but the bladder wall in this area was very thin and fat was identified quickly, so I primarily cauterized the tumor fronds of this particular lesion.  I then fulgurated larger areas of mucosa on the right lateral wall, anterior wall, and bladder neck.  The contiguous area of treatment was >5cm in diametera.  There was primarily carpet of tumor fronds in this area, however, in the right anterior bladder neck area, there were a couple of 1 cm papillary tumors.  It was very difficult to reach this area due to anatomic restrictions  despite pressure on anterior bladder wall, but I felt I was eventually able to fulgurate all of the tumor fronds in this area.  I fulgurated adjacent to, but not into the right ureteral orifice at this point in time.  He may need later re-resection or inspection depending on his pathology reports from the primary tumor.  Once the remainder of the visible tumor fronds had been cauterized, the bladder was evacuated of final amount of debris and final hemostasis was achieved.  I did have to cauterize around the bladder neck and prostate primarily anteriorly due to the extreme angulation had been required to reach the anterior tumors and some associated trauma to the anterior sulcus of the prostate.  But once hemostasis was achieved, the scope was removed.  A 22-French Foley catheter was inserted.  The balloon was filled with 10 mL of sterile fluid.  The catheter was held on traction and irrigated with clear return.  The traction was released and the return remained clear.  The catheter was placed to leg bag drainage.  The patient was taken down from lithotomy position.  His anesthetic was reversed and he was moved to recovery room in stable condition.  There were no complications.     Marshall Cork. Jeffie Pollock, M.D.   ______________________________ Marshall Cork. Jeffie Pollock, M.D.    JJW/MEDQ  D:  05/28/2017  T:  05/28/2017  Job:  [id number]

## 2017-05-28 NOTE — Anesthesia Preprocedure Evaluation (Signed)
Anesthesia Evaluation  Patient identified by MRN, date of birth, ID band Patient awake    Reviewed: Allergy & Precautions, NPO status , Patient's Chart, lab work & pertinent test results  Airway Mallampati: II  TM Distance: >3 FB Neck ROM: Full    Dental no notable dental hx. (+) Caps   Pulmonary sleep apnea and Continuous Positive Airway Pressure Ventilation , former smoker,    Pulmonary exam normal breath sounds clear to auscultation       Cardiovascular hypertension, Pt. on medications Normal cardiovascular exam Rhythm:Regular Rate:Normal     Neuro/Psych negative neurological ROS  negative psych ROS   GI/Hepatic negative GI ROS, Neg liver ROS,   Endo/Other  Hypothyroidism   Renal/GU negative Renal ROS  negative genitourinary   Musculoskeletal negative musculoskeletal ROS (+)   Abdominal   Peds negative pediatric ROS (+)  Hematology negative hematology ROS (+)   Anesthesia Other Findings   Reproductive/Obstetrics negative OB ROS                             Anesthesia Physical Anesthesia Plan  ASA: II  Anesthesia Plan: General   Post-op Pain Management:    Induction: Intravenous  PONV Risk Score and Plan: 2 and Ondansetron and Dexamethasone  Airway Management Planned: LMA  Additional Equipment:   Intra-op Plan:   Post-operative Plan: Extubation in OR  Informed Consent: I have reviewed the patients History and Physical, chart, labs and discussed the procedure including the risks, benefits and alternatives for the proposed anesthesia with the patient or authorized representative who has indicated his/her understanding and acceptance.   Dental advisory given  Plan Discussed with: CRNA  Anesthesia Plan Comments:         Anesthesia Quick Evaluation

## 2017-05-28 NOTE — Anesthesia Procedure Notes (Signed)
Procedure Name: Intubation Date/Time: 05/28/2017 7:42 AM Performed by: Justice Rocher Pre-anesthesia Checklist: Patient identified, Emergency Drugs available, Suction available and Patient being monitored Patient Re-evaluated:Patient Re-evaluated prior to inductionOxygen Delivery Method: Circle system utilized Preoxygenation: Pre-oxygenation with 100% oxygen Intubation Type: IV induction Ventilation: Mask ventilation without difficulty Tube type: Oral Tube size: 8.0 mm Number of attempts: 1 Airway Equipment and Method: Stylet and Oral airway Placement Confirmation: ETT inserted through vocal cords under direct vision,  positive ETCO2 and breath sounds checked- equal and bilateral Secured at: 23 cm Tube secured with: Tape Dental Injury: Teeth and Oropharynx as per pre-operative assessment

## 2017-05-28 NOTE — Anesthesia Postprocedure Evaluation (Signed)
Anesthesia Post Note  Patient: [name]  Procedure(s) Performed: Procedure(s) (LRB): TRANSURETHRAL RESECTION OF BLADDER TUMOR (TURBT) (N/A) CYSTOSCOPY (Right)     Patient location during evaluation: PACU Anesthesia Type: General Level of consciousness: awake and alert Pain management: pain level controlled Vital Signs Assessment: post-procedure vital signs reviewed and stable Respiratory status: spontaneous breathing, nonlabored ventilation, respiratory function stable and patient connected to nasal cannula oxygen Cardiovascular status: blood pressure returned to baseline and stable Postop Assessment: no signs of nausea or vomiting Anesthetic complications: no    Last Vitals:  Vitals:   05/28/17 0900 05/28/17 0915  BP: (!) 141/75 140/70  Pulse: 87 83  Resp: 12 15  Temp:      Last Pain:  Vitals:   05/28/17 0607  TempSrc: Oral                 [name]

## 2017-05-29 ENCOUNTER — Encounter (HOSPITAL_BASED_OUTPATIENT_CLINIC_OR_DEPARTMENT_OTHER): Payer: Self-pay | Admitting: Urology

## 2017-06-04 DIAGNOSIS — R350 Frequency of micturition: Secondary | ICD-10-CM | POA: Diagnosis not present

## 2017-06-04 DIAGNOSIS — C678 Malignant neoplasm of overlapping sites of bladder: Secondary | ICD-10-CM | POA: Diagnosis not present

## 2017-06-04 DIAGNOSIS — R3915 Urgency of urination: Secondary | ICD-10-CM | POA: Diagnosis not present

## 2017-06-09 ENCOUNTER — Other Ambulatory Visit: Payer: Self-pay | Admitting: Urology

## 2017-06-22 ENCOUNTER — Encounter (HOSPITAL_BASED_OUTPATIENT_CLINIC_OR_DEPARTMENT_OTHER): Payer: Self-pay | Admitting: *Deleted

## 2017-06-23 ENCOUNTER — Encounter (HOSPITAL_BASED_OUTPATIENT_CLINIC_OR_DEPARTMENT_OTHER): Payer: Self-pay | Admitting: *Deleted

## 2017-06-23 NOTE — Progress Notes (Signed)
NPO AFTER MN.  ARRIVE 0600.  NEEDS ISTAT 8 AND EKG.  WILL TAKE SYNTHROID, LIPITOR, AND LEXAPRO AM DOS W/ SIPS OF WATER.

## 2017-06-25 DIAGNOSIS — H524 Presbyopia: Secondary | ICD-10-CM | POA: Diagnosis not present

## 2017-06-25 DIAGNOSIS — H04123 Dry eye syndrome of bilateral lacrimal glands: Secondary | ICD-10-CM | POA: Diagnosis not present

## 2017-06-25 DIAGNOSIS — H2513 Age-related nuclear cataract, bilateral: Secondary | ICD-10-CM | POA: Diagnosis not present

## 2017-06-25 DIAGNOSIS — H5203 Hypermetropia, bilateral: Secondary | ICD-10-CM | POA: Diagnosis not present

## 2017-06-30 DIAGNOSIS — R350 Frequency of micturition: Secondary | ICD-10-CM | POA: Diagnosis not present

## 2017-07-06 NOTE — H&P (Signed)
CC: I have bladder cancer.  HPI: [name] is a 66 year-old male established patient who is here for bladder cancer.  His problem was diagnosed 05/28/2017. The bladder cancer was found because of blood in his urine.   His bladder cancer was treated by removal with scope. Patient denies removal of the entire bladder, radiation, and chemotherapy.   [name] returns today in f/u to discuss the findings from his recent TURBT. 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 There were tumor fronds adjacent to the right UO that were not resected but the remainder of the visible tumor was resected or fulgurated. Since the procedure he has had fairly marked frequency and urgency without incontinence. He is not sure he empties and can have some dribbling. He has had no fever or malaise.      ALLERGIES: None   MEDICATIONS: Atorvastatin Calcium 40 mg tablet  Escitalopram Oxalate 20 mg tablet  Lisinopril-Hydrochlorothiazide 20 mg-12.5 mg tablet  Methylphenidate Er 54 mg tablet, extended release 24 hr  Synthroid 137 mcg tablet     GU PSH: Cystoscopy TURBT >5 cm - 05/28/2017 Locm 300-399Mg /Ml Iodine,1Ml - 05/08/2017    NON-GU PSH: Appendectomy (open) Back surgery Knee Arthroscopy/surgery, MCL    GU PMH: BPH w/LUTS, He has LUTs with OAB. He wiill have an IPSS, flowrate and PVR as part of his evaluation. - 04/23/2017 Elevated PSA, His PSA is up over 4 with a low f/t ratio. I am going to repeat the PSA and if it remains elevated, I will have him return for a prostate [place] and biopsy. I have reviewed the risks of bleeding, infection and voiding difficulty. - 04/23/2017 Microscopic hematuria, He had 5-10 RBC's on UA and I will have him return for a CT hematuria study and cystoscopy. - 04/23/2017 Urinary Urgency - 04/23/2017    NON-GU PMH: Anxiety GERD Hypothyroidism Sleep Apnea    FAMILY HISTORY: 1 son - Other Colon  Cancer - Mother, Father heart failure - Father, Mother   SOCIAL HISTORY: Marital Status: Married Current Smoking Status: Patient does not smoke anymore. Has not smoked since 04/07/1997.   Tobacco Use Assessment Completed: Used Tobacco in last 30 days? Drinks 3 caffeinated drinks per day.     Notes: Retired   REVIEW OF SYSTEMS:    GU Review Male:   Patient reports frequent urination, hard to postpone urination, burning/ pain with urination, get up at night to urinate, stream starts and stops, trouble starting your stream, and have to strain to urinate . Patient denies leakage of urine, erection problems, and penile pain.  Gastrointestinal (Upper):   Patient denies nausea, vomiting, and indigestion/ heartburn.  Gastrointestinal (Lower):   Patient denies diarrhea and constipation.  Constitutional:   Patient denies fever, night sweats, weight loss, and fatigue.  Skin:   Patient denies skin rash/ lesion and itching.  Eyes:   Patient denies blurred vision and double vision.  Ears/ Nose/ Throat:   Patient denies sore throat and sinus problems.  Hematologic/Lymphatic:   Patient denies swollen glands and easy bruising.  Cardiovascular:   Patient denies leg swelling and chest pains.  Respiratory:   Patient denies cough and shortness of breath.  Endocrine:   Patient denies excessive thirst.  Musculoskeletal:   Patient denies joint pain and back pain.  Neurological:   Patient denies headaches and dizziness.  Psychologic:   Patient denies depression and anxiety.  VITAL SIGNS:      06/04/2017 01:34 PM  BP 112/72 mmHg  Heart Rate 113 /min  Temperature 97.1 F / 36 C   PAST DATA REVIEWED:  Source Of History:  Patient  Records Review:   Pathology Reports  Urine Test Review:   Urinalysis  Urodynamics Review:   Review Bladder Scan   04/23/17  PSA  Total PSA 2.34     PROCEDURES:            PVR Ultrasound - 67619  Scanned Volume: 0 cc         Urinalysis w/Scope Dipstick Dipstick Cont'd  Micro  Color: Yellow Bilirubin: Neg WBC/hpf: 0 - 5/hpf  Appearance: Cloudy Ketones: Trace RBC/hpf: 10 - 20/hpf  Specific Gravity: 1.025 Blood: 3+ Bacteria: NS (Not Seen)  pH: 5.5 Protein: 2+ Cystals: NS (Not Seen)  Glucose: Neg Urobilinogen: 0.2 Casts: NS (Not Seen)    Nitrites: Neg Trichomonas: Not Present    Leukocyte Esterase: Neg Mucous: Present      Epithelial Cells: 0 - 5/hpf      Yeast: NS (Not Seen)      Sperm: Not Present    ASSESSMENT:      ICD-10 Details  1 GU:   Bladder Cancer overlapping sites - C67.8 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 I am going to get him set up for restaging in 3-4 weeks and will do a TURBT with possible right ureteroscopy and stent. I will get a culture a week before. He will need BCG therapy afterwards. We discussed cystectomy but I think bladder preservation is his best first option.   2   Urinary Urgency - R39.15 He is emptying well but has marked frequency and urgency from the resection. I am going to give him Myrbetriq 25 and 50mg  samples to try and reviewed the side effects.   3   Urinary Frequency - R35.0    PLAN:           Schedule Labs: 2 Weeks - Urine Culture  Lab Notes: Needs to be done a week prior to surgery.  Return Visit/Planned Activity: 3 Weeks - Schedule Surgery          Document Letter(s):  Created for Patient: Clinical Summary

## 2017-07-07 ENCOUNTER — Ambulatory Visit (HOSPITAL_BASED_OUTPATIENT_CLINIC_OR_DEPARTMENT_OTHER): Payer: Medicare Other | Admitting: Anesthesiology

## 2017-07-07 ENCOUNTER — Encounter (HOSPITAL_BASED_OUTPATIENT_CLINIC_OR_DEPARTMENT_OTHER): Payer: Self-pay

## 2017-07-07 ENCOUNTER — Ambulatory Visit (HOSPITAL_BASED_OUTPATIENT_CLINIC_OR_DEPARTMENT_OTHER)
Admission: RE | Admit: 2017-07-07 | Discharge: 2017-07-07 | Disposition: A | Discharge status: Home / Self Care | Payer: Medicare Other | Source: Ambulatory Visit | Attending: Urology | Admitting: Urology

## 2017-07-07 ENCOUNTER — Other Ambulatory Visit: Payer: Self-pay

## 2017-07-07 ENCOUNTER — Encounter (HOSPITAL_BASED_OUTPATIENT_CLINIC_OR_DEPARTMENT_OTHER)
Admission: RE | Disposition: A | Discharge status: Home / Self Care | Payer: Self-pay | Source: Ambulatory Visit | Attending: Urology

## 2017-07-07 DIAGNOSIS — G4730 Sleep apnea, unspecified: Secondary | ICD-10-CM | POA: Diagnosis not present

## 2017-07-07 DIAGNOSIS — Z79899 Other long term (current) drug therapy: Secondary | ICD-10-CM | POA: Diagnosis not present

## 2017-07-07 DIAGNOSIS — I493 Ventricular premature depolarization: Secondary | ICD-10-CM | POA: Diagnosis not present

## 2017-07-07 DIAGNOSIS — N3281 Overactive bladder: Secondary | ICD-10-CM | POA: Diagnosis not present

## 2017-07-07 DIAGNOSIS — C678 Malignant neoplasm of overlapping sites of bladder: Secondary | ICD-10-CM | POA: Diagnosis not present

## 2017-07-07 DIAGNOSIS — Z8249 Family history of ischemic heart disease and other diseases of the circulatory system: Secondary | ICD-10-CM | POA: Insufficient documentation

## 2017-07-07 DIAGNOSIS — Z87891 Personal history of nicotine dependence: Secondary | ICD-10-CM | POA: Insufficient documentation

## 2017-07-07 DIAGNOSIS — C679 Malignant neoplasm of bladder, unspecified: Secondary | ICD-10-CM | POA: Diagnosis not present

## 2017-07-07 DIAGNOSIS — N401 Enlarged prostate with lower urinary tract symptoms: Secondary | ICD-10-CM | POA: Insufficient documentation

## 2017-07-07 DIAGNOSIS — Z8551 Personal history of malignant neoplasm of bladder: Secondary | ICD-10-CM | POA: Diagnosis not present

## 2017-07-07 DIAGNOSIS — N3081 Other cystitis with hematuria: Secondary | ICD-10-CM | POA: Insufficient documentation

## 2017-07-07 DIAGNOSIS — E039 Hypothyroidism, unspecified: Secondary | ICD-10-CM | POA: Insufficient documentation

## 2017-07-07 DIAGNOSIS — I10 Essential (primary) hypertension: Secondary | ICD-10-CM | POA: Diagnosis not present

## 2017-07-07 DIAGNOSIS — N3080 Other cystitis without hematuria: Secondary | ICD-10-CM | POA: Diagnosis not present

## 2017-07-07 DIAGNOSIS — D494 Neoplasm of unspecified behavior of bladder: Secondary | ICD-10-CM | POA: Diagnosis not present

## 2017-07-07 DIAGNOSIS — Z800 Family history of malignant neoplasm of digestive organs: Secondary | ICD-10-CM | POA: Diagnosis not present

## 2017-07-07 HISTORY — PX: CYSTOSCOPY WITH RETROGRADE PYELOGRAM, URETEROSCOPY AND STENT PLACEMENT: SHX5789

## 2017-07-07 HISTORY — DX: Overactive bladder: N32.81

## 2017-07-07 HISTORY — DX: Personal history of adenomatous and serrated colon polyps: Z86.0101

## 2017-07-07 HISTORY — DX: Benign prostatic hyperplasia with lower urinary tract symptoms: N40.1

## 2017-07-07 HISTORY — DX: Obstructive sleep apnea (adult) (pediatric): G47.33

## 2017-07-07 HISTORY — DX: Personal history of colonic polyps: Z86.010

## 2017-07-07 HISTORY — DX: Malignant neoplasm of bladder, unspecified: C67.9

## 2017-07-07 HISTORY — DX: Dependence on other enabling machines and devices: Z99.89

## 2017-07-07 HISTORY — PX: TRANSURETHRAL RESECTION OF BLADDER TUMOR: SHX2575

## 2017-07-07 LAB — POCT I-STAT, CHEM 8
BUN: 20 mg/dL (ref 6–20)
Calcium, Ion: 1.23 mmol/L (ref 1.15–1.40)
Chloride: 105 mmol/L (ref 101–111)
Creatinine, Ser: 1.1 mg/dL (ref 0.61–1.24)
GLUCOSE: 114 mg/dL — AB (ref 65–99)
HCT: 41 % (ref 39.0–52.0)
HEMOGLOBIN: 13.9 g/dL (ref 13.0–17.0)
POTASSIUM: 3.4 mmol/L — AB (ref 3.5–5.1)
Sodium: 141 mmol/L (ref 135–145)
TCO2: 24 mmol/L (ref 0–100)

## 2017-07-07 SURGERY — TURBT (TRANSURETHRAL RESECTION OF BLADDER TUMOR)
Anesthesia: General | Site: Bladder | Laterality: Right

## 2017-07-07 MED ORDER — ACETAMINOPHEN 10 MG/ML IV SOLN
INTRAVENOUS | Status: DC | PRN
  Administered 2017-07-07: 1000 mg via INTRAVENOUS

## 2017-07-07 MED ORDER — ONDANSETRON HCL 4 MG/2ML IJ SOLN
4.0000 mg | Freq: Once | INTRAMUSCULAR | Status: DC | PRN
  Filled 2017-07-07: qty 2

## 2017-07-07 MED ORDER — OXYCODONE HCL 5 MG PO TABS
5.0000 mg | ORAL_TABLET | Freq: Once | ORAL | Status: DC | PRN
  Filled 2017-07-07: qty 1

## 2017-07-07 MED ORDER — ROCURONIUM BROMIDE 10 MG/ML (PF) SYRINGE
PREFILLED_SYRINGE | INTRAVENOUS | Status: DC | PRN
  Administered 2017-07-07: 50 mg via INTRAVENOUS

## 2017-07-07 MED ORDER — MIDAZOLAM HCL 2 MG/2ML IJ SOLN
INTRAMUSCULAR | Status: AC
  Filled 2017-07-07: qty 2

## 2017-07-07 MED ORDER — SUGAMMADEX SODIUM 200 MG/2ML IV SOLN
INTRAVENOUS | Status: AC
  Filled 2017-07-07: qty 2

## 2017-07-07 MED ORDER — SUGAMMADEX SODIUM 200 MG/2ML IV SOLN
INTRAVENOUS | Status: DC | PRN
  Administered 2017-07-07: 200 mg via INTRAVENOUS

## 2017-07-07 MED ORDER — SODIUM CHLORIDE 0.9% FLUSH
3.0000 mL | Freq: Two times a day (BID) | INTRAVENOUS | Status: DC
  Filled 2017-07-07: qty 3

## 2017-07-07 MED ORDER — ACETAMINOPHEN 10 MG/ML IV SOLN
INTRAVENOUS | Status: AC
  Filled 2017-07-07: qty 100

## 2017-07-07 MED ORDER — SODIUM CHLORIDE 0.9% FLUSH
3.0000 mL | INTRAVENOUS | Status: DC | PRN
  Filled 2017-07-07: qty 3

## 2017-07-07 MED ORDER — MIDAZOLAM HCL 5 MG/5ML IJ SOLN
INTRAMUSCULAR | Status: DC | PRN
  Administered 2017-07-07: 2 mg via INTRAVENOUS

## 2017-07-07 MED ORDER — FENTANYL CITRATE (PF) 100 MCG/2ML IJ SOLN
25.0000 ug | INTRAMUSCULAR | Status: DC | PRN
Start: 2017-07-07 — End: 2017-07-07
  Filled 2017-07-07: qty 1

## 2017-07-07 MED ORDER — FENTANYL CITRATE (PF) 250 MCG/5ML IJ SOLN
INTRAMUSCULAR | Status: AC
  Filled 2017-07-07: qty 5

## 2017-07-07 MED ORDER — PROPOFOL 10 MG/ML IV BOLUS
INTRAVENOUS | Status: DC | PRN
  Administered 2017-07-07: 160 mg via INTRAVENOUS

## 2017-07-07 MED ORDER — CEFAZOLIN SODIUM-DEXTROSE 2-4 GM/100ML-% IV SOLN
INTRAVENOUS | Status: AC
  Filled 2017-07-07: qty 100

## 2017-07-07 MED ORDER — ACETAMINOPHEN 650 MG RE SUPP
650.0000 mg | RECTAL | Status: DC | PRN
  Filled 2017-07-07: qty 1

## 2017-07-07 MED ORDER — SODIUM CHLORIDE 0.9 % IR SOLN
Status: DC | PRN
  Administered 2017-07-07: 3000 mL via INTRAVESICAL
  Administered 2017-07-07: 6000 mL
  Administered 2017-07-07: 6000 mL via INTRAVESICAL

## 2017-07-07 MED ORDER — DEXAMETHASONE SODIUM PHOSPHATE 10 MG/ML IJ SOLN
INTRAMUSCULAR | Status: AC
  Filled 2017-07-07: qty 1

## 2017-07-07 MED ORDER — ACETAMINOPHEN 325 MG PO TABS
650.0000 mg | ORAL_TABLET | ORAL | Status: DC | PRN
  Filled 2017-07-07: qty 2

## 2017-07-07 MED ORDER — MORPHINE SULFATE (PF) 2 MG/ML IV SOLN
2.0000 mg | INTRAVENOUS | Status: DC | PRN
  Filled 2017-07-07: qty 1

## 2017-07-07 MED ORDER — LIDOCAINE 2% (20 MG/ML) 5 ML SYRINGE
INTRAMUSCULAR | Status: DC | PRN
  Administered 2017-07-07: 100 mg via INTRAVENOUS

## 2017-07-07 MED ORDER — ONDANSETRON HCL 4 MG/2ML IJ SOLN
INTRAMUSCULAR | Status: DC | PRN
  Administered 2017-07-07: 4 mg via INTRAVENOUS

## 2017-07-07 MED ORDER — CEFAZOLIN SODIUM-DEXTROSE 2-4 GM/100ML-% IV SOLN
2.0000 g | INTRAVENOUS | Status: AC
  Administered 2017-07-07: 2 g via INTRAVENOUS
  Filled 2017-07-07: qty 100

## 2017-07-07 MED ORDER — DEXAMETHASONE SODIUM PHOSPHATE 4 MG/ML IJ SOLN
INTRAMUSCULAR | Status: DC | PRN
  Administered 2017-07-07: 10 mg via INTRAVENOUS

## 2017-07-07 MED ORDER — FENTANYL CITRATE (PF) 100 MCG/2ML IJ SOLN
INTRAMUSCULAR | Status: DC | PRN
  Administered 2017-07-07: 50 ug via INTRAVENOUS
  Administered 2017-07-07: 100 ug via INTRAVENOUS

## 2017-07-07 MED ORDER — SODIUM CHLORIDE 0.9 % IV SOLN
250.0000 mL | INTRAVENOUS | Status: DC | PRN
  Filled 2017-07-07: qty 250

## 2017-07-07 MED ORDER — OXYCODONE HCL 5 MG PO TABS
5.0000 mg | ORAL_TABLET | ORAL | Status: DC | PRN
  Filled 2017-07-07: qty 2

## 2017-07-07 MED ORDER — OXYCODONE HCL 5 MG/5ML PO SOLN
5.0000 mg | Freq: Once | ORAL | Status: DC | PRN
  Filled 2017-07-07: qty 5

## 2017-07-07 MED ORDER — LACTATED RINGERS IV SOLN
INTRAVENOUS | Status: DC
  Administered 2017-07-07: 07:00:00 via INTRAVENOUS
  Filled 2017-07-07: qty 1000

## 2017-07-07 MED ORDER — ONDANSETRON HCL 4 MG/2ML IJ SOLN
INTRAMUSCULAR | Status: AC
  Filled 2017-07-07: qty 2

## 2017-07-07 MED ORDER — STERILE WATER FOR IRRIGATION IR SOLN
Status: DC | PRN
  Administered 2017-07-07: 1000 mL via INTRAVESICAL

## 2017-07-07 MED ORDER — PROPOFOL 10 MG/ML IV BOLUS
INTRAVENOUS | Status: AC
  Filled 2017-07-07: qty 40

## 2017-07-07 SURGICAL SUPPLY — 41 items
BAG DRAIN URO-CYSTO SKYTR STRL (DRAIN) ×4 IMPLANT
BAG DRN ANRFLXCHMBR STRAP LEK (BAG)
BAG DRN UROCATH (DRAIN) ×2
BAG URINE DRAINAGE (UROLOGICAL SUPPLIES) ×2 IMPLANT
BAG URINE LEG 19OZ MD ST LTX (BAG) IMPLANT
BASKET STONE 1.7 NGAGE (UROLOGICAL SUPPLIES) IMPLANT
BASKET ZERO TIP NITINOL 2.4FR (BASKET) IMPLANT
BSKT STON RTRVL ZERO TP 2.4FR (BASKET)
CATH FOLEY 2WAY SLVR  5CC 20FR (CATHETERS)
CATH FOLEY 2WAY SLVR  5CC 22FR (CATHETERS) ×2
CATH FOLEY 2WAY SLVR 5CC 20FR (CATHETERS) IMPLANT
CATH FOLEY 2WAY SLVR 5CC 22FR (CATHETERS) IMPLANT
CATH URET 5FR 28IN CONE TIP (BALLOONS)
CATH URET 5FR 28IN OPEN ENDED (CATHETERS) IMPLANT
CATH URET 5FR 70CM CONE TIP (BALLOONS) IMPLANT
CLOTH BEACON ORANGE TIMEOUT ST (SAFETY) ×4 IMPLANT
ELECT REM PT RETURN 9FT ADLT (ELECTROSURGICAL) ×4
ELECTRODE REM PT RTRN 9FT ADLT (ELECTROSURGICAL) ×2 IMPLANT
FIBER LASER FLEXIVA 365 (UROLOGICAL SUPPLIES) IMPLANT
FIBER LASER TRAC TIP (UROLOGICAL SUPPLIES) IMPLANT
GLOVE SURG SS PI 8.0 STRL IVOR (GLOVE) ×4 IMPLANT
GOWN STRL REUS W/ TWL XL LVL3 (GOWN DISPOSABLE) ×2 IMPLANT
GOWN STRL REUS W/TWL LRG LVL3 (GOWN DISPOSABLE) IMPLANT
GOWN STRL REUS W/TWL XL LVL3 (GOWN DISPOSABLE) ×8 IMPLANT
GUIDEWIRE 0.038 PTFE COATED (WIRE) IMPLANT
GUIDEWIRE ANG ZIPWIRE 038X150 (WIRE) IMPLANT
GUIDEWIRE STR DUAL SENSOR (WIRE) ×4 IMPLANT
HOLDER FOLEY CATH W/STRAP (MISCELLANEOUS) IMPLANT
INFUSOR MANOMETER BAG 3000ML (MISCELLANEOUS) ×4 IMPLANT
IV NS IRRIG 3000ML ARTHROMATIC (IV SOLUTION) ×12 IMPLANT
KIT RM TURNOVER CYSTO AR (KITS) ×4 IMPLANT
LOOP CUT BIPOLAR 24F LRG (ELECTROSURGICAL) ×2 IMPLANT
MANIFOLD NEPTUNE II (INSTRUMENTS) ×4 IMPLANT
PACK CYSTO (CUSTOM PROCEDURE TRAY) ×4 IMPLANT
PLUG CATH AND CAP STER (CATHETERS) IMPLANT
SET ASPIRATION TUBING (TUBING) IMPLANT
SHEATH ACCESS URETERAL 38CM (SHEATH) IMPLANT
SYRINGE IRR TOOMEY STRL 70CC (SYRINGE) ×2 IMPLANT
TUBE CONNECTING 12'X1/4 (SUCTIONS)
TUBE CONNECTING 12X1/4 (SUCTIONS) IMPLANT
WATER STERILE IRR 1000ML POUR (IV SOLUTION) ×4 IMPLANT

## 2017-07-07 NOTE — Transfer of Care (Signed)
Immediate Anesthesia Transfer of Care Note  Patient: [name]  Procedure(s) Performed: Procedure(s): RESTAGING TRANSURETHRAL RESECTION OF BLADDER TUMOR (TURBT) (N/A) CYSTOSCOPY (Right)  Patient Location: PACU  Anesthesia Type:General  Level of Consciousness: awake, alert , oriented and patient cooperative  Airway & Oxygen Therapy: Patient Spontanous Breathing and Patient connected to nasal cannula oxygen  Post-op Assessment: Report given to RN and Post -op Vital signs reviewed and stable  Post vital signs: Reviewed and stable  Last Vitals:  Vitals:   07/07/17 0606  BP: 130/72  Pulse: 83  Resp: 18  Temp: 36.6 C    Last Pain:  Vitals:   07/07/17 0606  TempSrc: Oral      Patients Stated Pain Goal: 8 (27/78/24 2353)  Complications: No apparent anesthesia complications

## 2017-07-07 NOTE — Op Note (Signed)
07/07/2017  8:29 AM  PATIENT:  [name]  66 y.o. male  PRE-OPERATIVE DIAGNOSIS:  BLADDER CANCER  POST-OPERATIVE DIAGNOSIS:  BLADDER CANCER OVERLAPPING SITES  PROCEDURE:  CYSTOSCOPY WITH TURBT LARGE TUMOR  SURGEON:  Surgeon(s) and Role:    * Irine Seal, MD - Primary  PHYSICIAN ASSISTANT:   ASSISTANTS: none   ANESTHESIA:   general  EBL:  Total I/O In: 400 [I.V.:400] Out: 50 [Blood:50]  BLOOD ADMINISTERED:none  DRAINS: Urinary Catheter (Foley)   LOCAL MEDICATIONS USED:  NONE  SPECIMEN:  Source of Specimen:  RIGHT AND LEFT BLADDER WALL SPECIMENS  DISPOSITION OF SPECIMEN:  PATHOLOGY  COUNTS:  YES  TOURNIQUET:  * No tourniquets in log *  DICTATION: .Other Dictation: Dictation Number [id number]  PLAN OF CARE: Discharge to home after PACU  PATIENT DISPOSITION:  PACU - hemodynamically stable.   Delay start of Pharmacological VTE agent (>24hrs) due to surgical blood loss or risk of bleeding: yes

## 2017-07-07 NOTE — Interval H&P Note (Signed)
History and Physical Interval Note:  07/07/2017 7:29 AM  [name]  has presented today for surgery, with the diagnosis of BLADDER CANCER  The various methods of treatment have been discussed with the patient and family. After consideration of risks, benefits and other options for treatment, the patient has consented to  Procedure(s): RESTAGING TRANSURETHRAL RESECTION OF BLADDER TUMOR (TURBT) (N/A) CYSTOSCOPY WITH POSSIBLE RIGHT RETROGRADE  URETEROSCOPY AND STENT PLACEMENT (Right) as a surgical intervention .  The patient's history has been reviewed, patient examined, no change in status, stable for surgery.  I have reviewed the patient's chart and labs.  Questions were answered to the patient's satisfaction.     Raylyn Speckman J

## 2017-07-07 NOTE — Anesthesia Procedure Notes (Signed)
Procedure Name: Intubation Date/Time: 07/07/2017 7:42 AM Performed by: Wanita Chamberlain Pre-anesthesia Checklist: Patient identified, Timeout performed, Emergency Drugs available, Suction available and Patient being monitored Patient Re-evaluated:Patient Re-evaluated prior to induction Oxygen Delivery Method: Circle system utilized Preoxygenation: Pre-oxygenation with 100% oxygen Induction Type: IV induction Ventilation: Mask ventilation without difficulty Laryngoscope Size: Mac and 4 Grade View: Grade I Tube type: Oral Tube size: 7.5 mm Number of attempts: 1 Airway Equipment and Method: Stylet and Bite block (gauze b/t teeth) Placement Confirmation: ETT inserted through vocal cords under direct vision,  positive ETCO2 and breath sounds checked- equal and bilateral Secured at: 22 cm Tube secured with: Tape Dental Injury: Teeth and Oropharynx as per pre-operative assessment

## 2017-07-07 NOTE — Anesthesia Preprocedure Evaluation (Addendum)
Anesthesia Evaluation  Patient identified by MRN, date of birth, ID band Patient awake    Reviewed: Allergy & Precautions, NPO status , Patient's Chart, lab work & pertinent test results  Airway Mallampati: II  TM Distance: >3 FB Neck ROM: Full    Dental  (+) Teeth Intact, Caps, Dental Advisory Given,    Pulmonary sleep apnea and Continuous Positive Airway Pressure Ventilation , former smoker,    breath sounds clear to auscultation       Cardiovascular Exercise Tolerance: Good hypertension, Pt. on medications  Rhythm:Regular Rate:Normal     Neuro/Psych    GI/Hepatic negative GI ROS, Neg liver ROS,   Endo/Other  Hypothyroidism   Renal/GU negative Renal ROS     Musculoskeletal negative musculoskeletal ROS (+)   Abdominal   Peds  Hematology negative hematology ROS (+)   Anesthesia Other Findings   Reproductive/Obstetrics                          Anesthesia Physical Anesthesia Plan  ASA: III  Anesthesia Plan: General   Post-op Pain Management:    Induction: Intravenous  PONV Risk Score and Plan: Ondansetron and Dexamethasone  Airway Management Planned: LMA  Additional Equipment:   Intra-op Plan:   Post-operative Plan:   Informed Consent: I have reviewed the patients History and Physical, chart, labs and discussed the procedure including the risks, benefits and alternatives for the proposed anesthesia with the patient or authorized representative who has indicated his/her understanding and acceptance.   Dental advisory given  Plan Discussed with: CRNA and Anesthesiologist  Anesthesia Plan Comments:         Anesthesia Quick Evaluation

## 2017-07-07 NOTE — Discharge Instructions (Addendum)
Post Anesthesia Home Care Instructions  Activity: Get plenty of rest for the remainder of the day. A responsible individual must stay with you for 24 hours following the procedure.  For the next 24 hours, DO NOT: -Drive a car -Paediatric nurse -Drink alcoholic beverages -Take any medication unless instructed by your physician -Make any legal decisions or sign important papers.  Meals: Start with liquid foods such as gelatin or soup. Progress to regular foods as tolerated. Avoid greasy, spicy, heavy foods. If nausea and/or vomiting occur, drink only clear liquids until the nausea and/or vomiting subsides. Call your physician if vomiting continues.  Special Instructions/Symptoms: Your throat may feel dry or sore from the anesthesia or the breathing tube placed in your throat during surgery. If this causes discomfort, gargle with warm salt water. The discomfort should disappear within 24 hours.  If you had a scopolamine patch placed behind your ear for the management of post- operative nausea and/or vomiting:  1. The medication in the patch is effective for 72 hours, after which it should be removed.  Wrap patch in a tissue and discard in the trash. Wash hands thoroughly with soap and water. 2. You may remove the patch earlier than 72 hours if you experience unpleasant side effects which may include dry mouth, dizziness or visual disturbances. 3. Avoid touching the patch. Wash your hands with soap and water after contact with the patch.   CYSTOSCOPY HOME CARE INSTRUCTIONS  Activity: Rest for the remainder of the day.  Do not drive or operate equipment today.  You may resume normal activities in one to two days as instructed by your physician.   Meals: Drink plenty of liquids and eat light foods such as gelatin or soup this evening.  You may return to a normal meal plan tomorrow.  Return to Work: You may return to work in one to two days or as instructed by your physician.  Special  Instructions / Symptoms: Call your physician if any of these symptoms occur:   -persistent or heavy bleeding  -bleeding which continues after first few urination  -large blood clots that are difficult to pass  -urine stream diminishes or stops completely  -fever equal to or higher than 101 degrees Farenheit.  -cloudy urine with a strong, foul odor  -severe pain  Females should always wipe from front to back after elimination.  You may feel some burning pain when you urinate.  This should disappear with time.  Applying moist heat to the lower abdomen or a hot tub bath may help relieve the pain. \  Indwelling Urinary Catheter Care, Adult Take good care of your catheter to keep it working and to prevent problems. How to wear your catheter Attach your catheter to your leg with tape (adhesive tape) or a leg strap. Make sure it is not too tight. If you use tape, remove any bits of tape that are already on the catheter. How to wear a drainage bag You should have:  A large overnight bag.  A small leg bag.  Overnight Bag You may wear the overnight bag at any time. Always keep the bag below the level of your bladder but off the floor. When you sleep, put a clean plastic bag in a wastebasket. Then hang the bag inside the wastebasket. Leg Bag Never wear the leg bag at night. Always wear the leg bag below your knee. Keep the leg bag secure with a leg strap or tape. How to care for your skin  Clean the skin around the catheter at least once every day.  Shower every day. Do not take baths.  Put creams, lotions, or ointments on your genital area only as told by your doctor.  Do not use powders, sprays, or lotions on your genital area. How to clean your catheter and your skin 1. Wash your hands with soap and water. 2. Wet a washcloth in warm water and gentle (mild) soap. 3. Use the washcloth to clean the skin where the catheter enters your body. Clean downward and wipe away from the catheter  in small circles. Do not wipe toward the catheter. 4. Pat the area dry with a clean towel. Make sure to clean off all soap. How to care for your drainage bags Empty your drainage bag when it is ?- full or at least 2-3 times a day. Replace your drainage bag once a month or sooner if it starts to smell bad or look dirty. Do not clean your drainage bag unless told by your doctor. Emptying a drainage bag  Supplies Needed  Rubbing alcohol.  Gauze pad or cotton ball.  Tape or a leg strap.  Steps 1. Wash your hands with soap and water. 2. Separate (detach) the bag from your leg. 3. Hold the bag over the toilet or a clean container. Keep the bag below your hips and bladder. This stops pee (urine) from going back into the tube. 4. Open the pour spout at the bottom of the bag. 5. Empty the pee into the toilet or container. Do not let the pour spout touch any surface. 6. Put rubbing alcohol on a gauze pad or cotton ball. 7. Use the gauze pad or cotton ball to clean the pour spout. 8. Close the pour spout. 9. Attach the bag to your leg with tape or a leg strap. 10. Wash your hands.  Changing a drainage bag Supplies Needed  Alcohol wipes.  A clean drainage bag.  Adhesive tape or a leg strap.  Steps 1. Wash your hands with soap and water. 2. Separate the dirty bag from your leg. 3. Pinch the rubber catheter with your fingers so that pee does not spill out. 4. Separate the catheter tube from the drainage tube where these tubes connect (at the connection valve). Do not let the tubes touch any surface. 5. Clean the end of the catheter tube with an alcohol wipe. Use a different alcohol wipe to clean the end of the drainage tube. 6. Connect the catheter tube to the drainage tube of the clean bag. 7. Attach the new bag to the leg with adhesive tape or a leg strap. 8. Wash your hands.  How to prevent infection and other problems  Never pull on your catheter or try to remove it. Pulling can  damage tissue in your body.  Always wash your hands before and after touching your catheter.  If a leg strap gets wet, replace it with a dry one.  Drink enough fluids to keep your pee clear or pale yellow, or as told by your doctor.  Do not let the drainage bag or tubing touch the floor.  Wear cotton underwear.  If you are male, wipe from front to back after you poop (have a bowel movement).  Check on the catheter often to make sure it works and the tubing is not twisted. Get help if:  Your pee is cloudy.  Your pee smells unusually bad.  Your pee is not draining into the bag.  Your tube  gets clogged.  Your catheter starts to leak.  Your bladder feels full. Get help right away if:  You have redness, swelling, or pain where the catheter enters your body.  You have fluid, pus, or a bad smell coming from the area where the catheter enters your body.  The area where the catheter enters your body feels warm.  You have a fever.  You have pain in your: ? Stomach (abdomen). ? Legs. ? Lower back. ? Bladder.  You see blood fill the catheter.  Your pee is pink or red.  You feel sick to your stomach (nauseous).  You throw up (vomit).  You have chills.  Your catheter gets pulled out.  You may remove the foley in the morning if the urine is clear.   Hold ASA for 48 hours.    This information is not intended to replace advice given to you by your health care provider. Make sure you discuss any questions you have with your health care provider. Document Released: 03/21/2013 Document Revised: 10/22/2016 Document Reviewed: 05/09/2014 Elsevier Interactive Patient Education  [employer].

## 2017-07-07 NOTE — Anesthesia Postprocedure Evaluation (Signed)
Anesthesia Post Note  Patient: [name]  Procedure(s) Performed: Procedure(s) (LRB): RESTAGING TRANSURETHRAL RESECTION OF BLADDER TUMOR (TURBT) (N/A) CYSTOSCOPY (Right)     Patient location during evaluation: PACU Anesthesia Type: General Level of consciousness: awake, awake and alert and oriented Pain management: pain level controlled Vital Signs Assessment: post-procedure vital signs reviewed and stable Respiratory status: spontaneous breathing, nonlabored ventilation and respiratory function stable Cardiovascular status: blood pressure returned to baseline Anesthetic complications: no    Last Vitals:  Vitals:   07/07/17 0945 07/07/17 1024  BP: 130/72 (!) 152/66  Pulse: (!) 58 (!) 58  Resp: 14 16  Temp:  36.7 C    Last Pain:  Vitals:   07/07/17 0606  TempSrc: Oral                 Candice Tobey COKER

## 2017-07-08 ENCOUNTER — Encounter (HOSPITAL_BASED_OUTPATIENT_CLINIC_OR_DEPARTMENT_OTHER): Payer: Self-pay | Admitting: Urology

## 2017-07-08 NOTE — Op Note (Signed)
NAMEJUELL, RADNEY               ACCOUNT NO.:  [id number]  MEDICAL RECORD NO.:  [id number]  LOCATION:                                 FACILITY:  PHYSICIAN:  Marshall Cork. Jeffie Pollock, M.D.    DATE OF BIRTH:  June 14, 1951  DATE OF PROCEDURE:  07/07/2017 DATE OF DISCHARGE:                              OPERATIVE REPORT   PROCEDURE:  Cystoscopy with transurethral resection of large bladder tumor and overlapping sites.  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  SURGEON:  Marshall Cork. Jeffie Pollock, M.D.  ANESTHESIA:  General.  SPECIMEN:  Right and left bladder wall tumor chips.  BLOOD LOSS:  Minimal.  DRAINS:  22-French Foley catheter.  COMPLICATIONS:  None.  INDICATIONS:  Mr. Talavera is a 66 year old white male who previously undergone resection of bladder tumor and was found to have T1 high-grade nonmuscle invasive bladder cancer and re-resection was indicated.  The tumor involved the area of the right ureteral orifice, which was not resected on the initial procedure, and it was felt that cystoscopy, possible retrograde, possible ureteroscopy with restaging TURBT was indicated.  FINDINGS AND PROCEDURE:  He was taken to the operating room where he was given Ancef.  A general anesthetic was induced.  He was placed in lithotomy position and fitted with PAS hose.  His perineum and genitalia were prepped with Betadine solution and was draped in usual sterile fashion.  Cystoscopy was performed using the 23-French scope and 70-degree lens. Examination revealed the normal urethra.  The external sphincter was intact.  The prostatic urethra had bilobar hyperplasia with minimal obstruction.  Examination of the bladder revealed areas of prior resection that extended from the anterior midline wrapping around the left lateral wall and  bladder neck into the trigone and across the trigone to approximately 4 o'clock position.  Tumor bed diameter of greater than 5 cm.  There was an additional stellate scar on the left lateral wall with some surrounding edema from an additional tumor in that area.  The right ureteral orifice was not identified nor was the left ureteral orifice.  There was mild trabeculation.  No other tumors were noted.  The bulk of what I saw in the area of the tumor was edema, but no obvious tumor fronds were noted.  I then replaced the cystoscope with the 28-French continuous flow resectoscope sheath, fitted with an Beatrix Fetters handle, a bipolar loop and 30-degree lens.  Saline was used as the irrigant.  The patient was intubated and paralyzed to avoid obturator reflex.  I then re-resected the prior resection bed extending from the anterior bladder neck on the right down the right lateral wall and across the trigone.  I was never able to identify the right ureteral orifice even after the resection and did not see efflux of urine.  This will need to be addressed postoperatively with ultrasound.  I then evacuated the chips from the right side and obtained hemostasis. I then resected the tumor bed on the left as well as some additional area of prior resection on the left  bladder neck.  I avoided the left trigone to avoid injury to the left ureteral orifice.  There was edema in this area, but no tumor fronds were noted.  These chips were removed as a separate specimen and hemostasis was achieved.  Inspection revealed no active bleeding and no residual resection chips.  The resectoscope was removed.  The Foley catheter was inserted.  The balloon was filled initially with 10 and 20 mL of fluid and irrigation was performed, but I was unable to get the urine clear, so the catheter was removed, the resectoscope was reinserted and a bleeder was noted on the right lateral wall, which was fulgurated.  Some  additional fulguration at the bladder neck and anterior prostatic commissure were performed as well.  The Foley catheter was then replaced.  The balloon was filled with 10 mL of sterile fluid.  The catheter irrigated with clear return.  The catheter was placed to straight drainage.  The patient was taken down from lithotomy position.  His anesthetic was reversed.  He was moved to the recovery room in stable condition.  There were no complications.     Marshall Cork. Jeffie Pollock, M.D.   ______________________________ Marshall Cork. Jeffie Pollock, M.D.    JJW/MEDQ  D:  07/07/2017  T:  07/07/2017  Job:  [id number]

## 2017-07-22 DIAGNOSIS — C679 Malignant neoplasm of bladder, unspecified: Secondary | ICD-10-CM | POA: Diagnosis not present

## 2017-07-22 DIAGNOSIS — C678 Malignant neoplasm of overlapping sites of bladder: Secondary | ICD-10-CM | POA: Diagnosis not present

## 2017-07-22 DIAGNOSIS — R3915 Urgency of urination: Secondary | ICD-10-CM | POA: Diagnosis not present

## 2017-07-30 DIAGNOSIS — D2261 Melanocytic nevi of right upper limb, including shoulder: Secondary | ICD-10-CM | POA: Diagnosis not present

## 2017-07-30 DIAGNOSIS — L814 Other melanin hyperpigmentation: Secondary | ICD-10-CM | POA: Diagnosis not present

## 2017-07-30 DIAGNOSIS — D2371 Other benign neoplasm of skin of right lower limb, including hip: Secondary | ICD-10-CM | POA: Diagnosis not present

## 2017-07-30 DIAGNOSIS — L570 Actinic keratosis: Secondary | ICD-10-CM | POA: Diagnosis not present

## 2017-07-30 DIAGNOSIS — L821 Other seborrheic keratosis: Secondary | ICD-10-CM | POA: Diagnosis not present

## 2017-07-30 DIAGNOSIS — D485 Neoplasm of uncertain behavior of skin: Secondary | ICD-10-CM | POA: Diagnosis not present

## 2017-07-30 DIAGNOSIS — L918 Other hypertrophic disorders of the skin: Secondary | ICD-10-CM | POA: Diagnosis not present

## 2017-07-30 DIAGNOSIS — D225 Melanocytic nevi of trunk: Secondary | ICD-10-CM | POA: Diagnosis not present

## 2017-07-30 DIAGNOSIS — D1801 Hemangioma of skin and subcutaneous tissue: Secondary | ICD-10-CM | POA: Diagnosis not present

## 2017-07-30 DIAGNOSIS — D2262 Melanocytic nevi of left upper limb, including shoulder: Secondary | ICD-10-CM | POA: Diagnosis not present

## 2017-08-06 DIAGNOSIS — Z5111 Encounter for antineoplastic chemotherapy: Secondary | ICD-10-CM | POA: Diagnosis not present

## 2017-08-06 DIAGNOSIS — C678 Malignant neoplasm of overlapping sites of bladder: Secondary | ICD-10-CM | POA: Diagnosis not present

## 2017-08-07 DIAGNOSIS — L570 Actinic keratosis: Secondary | ICD-10-CM | POA: Diagnosis not present

## 2017-08-11 DIAGNOSIS — I2510 Atherosclerotic heart disease of native coronary artery without angina pectoris: Secondary | ICD-10-CM | POA: Diagnosis not present

## 2017-08-11 DIAGNOSIS — E038 Other specified hypothyroidism: Secondary | ICD-10-CM | POA: Diagnosis not present

## 2017-08-11 DIAGNOSIS — E668 Other obesity: Secondary | ICD-10-CM | POA: Diagnosis not present

## 2017-08-11 DIAGNOSIS — E784 Other hyperlipidemia: Secondary | ICD-10-CM | POA: Diagnosis not present

## 2017-08-11 DIAGNOSIS — D414 Neoplasm of uncertain behavior of bladder: Secondary | ICD-10-CM | POA: Diagnosis not present

## 2017-08-11 DIAGNOSIS — F900 Attention-deficit hyperactivity disorder, predominantly inattentive type: Secondary | ICD-10-CM | POA: Diagnosis not present

## 2017-08-11 DIAGNOSIS — I10 Essential (primary) hypertension: Secondary | ICD-10-CM | POA: Diagnosis not present

## 2017-08-11 DIAGNOSIS — Z6831 Body mass index (BMI) 31.0-31.9, adult: Secondary | ICD-10-CM | POA: Diagnosis not present

## 2017-08-11 DIAGNOSIS — Z23 Encounter for immunization: Secondary | ICD-10-CM | POA: Diagnosis not present

## 2017-08-13 DIAGNOSIS — C678 Malignant neoplasm of overlapping sites of bladder: Secondary | ICD-10-CM | POA: Diagnosis not present

## 2017-08-13 DIAGNOSIS — Z5111 Encounter for antineoplastic chemotherapy: Secondary | ICD-10-CM | POA: Diagnosis not present

## 2017-08-20 DIAGNOSIS — C678 Malignant neoplasm of overlapping sites of bladder: Secondary | ICD-10-CM | POA: Diagnosis not present

## 2017-08-20 DIAGNOSIS — Z5111 Encounter for antineoplastic chemotherapy: Secondary | ICD-10-CM | POA: Diagnosis not present

## 2017-08-27 DIAGNOSIS — Z5111 Encounter for antineoplastic chemotherapy: Secondary | ICD-10-CM | POA: Diagnosis not present

## 2017-08-27 DIAGNOSIS — C678 Malignant neoplasm of overlapping sites of bladder: Secondary | ICD-10-CM | POA: Diagnosis not present

## 2017-09-03 DIAGNOSIS — Z5111 Encounter for antineoplastic chemotherapy: Secondary | ICD-10-CM | POA: Diagnosis not present

## 2017-09-03 DIAGNOSIS — C678 Malignant neoplasm of overlapping sites of bladder: Secondary | ICD-10-CM | POA: Diagnosis not present

## 2017-09-10 DIAGNOSIS — C678 Malignant neoplasm of overlapping sites of bladder: Secondary | ICD-10-CM | POA: Diagnosis not present

## 2017-09-10 DIAGNOSIS — Z5111 Encounter for antineoplastic chemotherapy: Secondary | ICD-10-CM | POA: Diagnosis not present

## 2017-09-10 DIAGNOSIS — R350 Frequency of micturition: Secondary | ICD-10-CM | POA: Diagnosis not present

## 2017-09-14 DIAGNOSIS — L089 Local infection of the skin and subcutaneous tissue, unspecified: Secondary | ICD-10-CM | POA: Diagnosis not present

## 2017-09-14 DIAGNOSIS — Z6832 Body mass index (BMI) 32.0-32.9, adult: Secondary | ICD-10-CM | POA: Diagnosis not present

## 2017-09-30 DIAGNOSIS — L0889 Other specified local infections of the skin and subcutaneous tissue: Secondary | ICD-10-CM | POA: Diagnosis not present

## 2017-09-30 DIAGNOSIS — Z6832 Body mass index (BMI) 32.0-32.9, adult: Secondary | ICD-10-CM | POA: Diagnosis not present

## 2017-10-01 DIAGNOSIS — L98499 Non-pressure chronic ulcer of skin of other sites with unspecified severity: Secondary | ICD-10-CM | POA: Diagnosis not present

## 2017-10-01 DIAGNOSIS — B359 Dermatophytosis, unspecified: Secondary | ICD-10-CM | POA: Diagnosis not present

## 2017-10-01 DIAGNOSIS — D485 Neoplasm of uncertain behavior of skin: Secondary | ICD-10-CM | POA: Diagnosis not present

## 2017-10-01 DIAGNOSIS — A319 Mycobacterial infection, unspecified: Secondary | ICD-10-CM | POA: Diagnosis not present

## 2017-10-01 DIAGNOSIS — L0889 Other specified local infections of the skin and subcutaneous tissue: Secondary | ICD-10-CM | POA: Diagnosis not present

## 2017-11-09 DIAGNOSIS — J0100 Acute maxillary sinusitis, unspecified: Secondary | ICD-10-CM | POA: Diagnosis not present

## 2017-11-09 DIAGNOSIS — R05 Cough: Secondary | ICD-10-CM | POA: Diagnosis not present

## 2017-11-09 DIAGNOSIS — J40 Bronchitis, not specified as acute or chronic: Secondary | ICD-10-CM | POA: Diagnosis not present

## 2017-11-09 DIAGNOSIS — Z6832 Body mass index (BMI) 32.0-32.9, adult: Secondary | ICD-10-CM | POA: Diagnosis not present

## 2017-11-18 DIAGNOSIS — Z8551 Personal history of malignant neoplasm of bladder: Secondary | ICD-10-CM | POA: Diagnosis not present

## 2017-12-09 DIAGNOSIS — A311 Cutaneous mycobacterial infection: Secondary | ICD-10-CM | POA: Diagnosis not present

## 2018-01-25 ENCOUNTER — Ambulatory Visit (INDEPENDENT_AMBULATORY_CARE_PROVIDER_SITE_OTHER): Payer: Medicare Other | Admitting: Neurology

## 2018-01-25 ENCOUNTER — Encounter: Payer: Self-pay | Admitting: Neurology

## 2018-01-25 VITALS — BP 119/71 | HR 88 | Ht 71.0 in | Wt 223.0 lb

## 2018-01-25 DIAGNOSIS — G4733 Obstructive sleep apnea (adult) (pediatric): Secondary | ICD-10-CM

## 2018-01-25 DIAGNOSIS — Z9989 Dependence on other enabling machines and devices: Secondary | ICD-10-CM

## 2018-01-25 NOTE — Patient Instructions (Signed)
Please continue using your CPAP regularly. While your insurance requires that you use CPAP at least 4 hours each night on 70% of the nights, I recommend, that you not skip any nights and use it throughout the night if you can. Getting used to CPAP and staying with the treatment long term does take time and patience and discipline. Untreated obstructive sleep apnea when it is moderate to severe can have an adverse impact on cardiovascular health and raise her risk for heart disease, arrhythmias, hypertension, congestive heart failure, stroke and diabetes. Untreated obstructive sleep apnea causes sleep disruption, nonrestorative sleep, and sleep deprivation. This can have an impact on your day to day functioning and cause daytime sleepiness and impairment of cognitive function, memory loss, mood disturbance, and problems focussing. Using CPAP regularly can improve these symptoms.  Keep up the good work! I will see you back in 1 year.   We will have you come back for a day time appointment in the sleep lab to work with Shirlean Mylar, our sleep lab manager for a mask fitting appointment. I will ask her to reach out to you.

## 2018-01-25 NOTE — Progress Notes (Signed)
Subjective:    Patient ID: [name] is a 67 y.o. male.  HPI     Interim history:   [name] is a 67 year old right-handed gentleman with an underlying medical history of ADD, reflux disease, hypothyroidism, hyperlipidemia, depression, anxiety, hypertension, diverticulosis, allergic rhinitis, recent diagnosis of bladder cancer, and obesity, who presents for follow-up consultation of his obstructive sleep apnea, on CPAP therapy, for his one-year checkup. The patient is unaccompanied today. I last saw him on 01/22/2017, at which time he was compliant with CPAP. He had trouble with maintaining a good feel on the mask. He also received recent replacement CPAP machine due to a defect in his old machine.  Today, 01/25/2018 (all dictated new, as well as above notes, some dictation done in note pad or Word, outside of chart, may appear as copied):    I reviewed his CPAP compliance data from 12/22/2013 through 09/19/2018 which is a total of 30 days, during which time he used his machine 29 days with percent used days greater than 4 hours at 83%, indicating very good compliance with an average usage of 6 hours and 25 minutes, residual AHI 3.5 per hour, leak high with the 95th percentile at 50.8 L/m on a pressure of 13 cm with EPR of 3. He reports doing well, had a rough time for a while with bronchitis and pneumonia recently. Of note, he has been diagnosed with bladder cancer and has undergone surgeries in June and July 2018. He had subsequent immunotherapy with BCG. He has done well, overall. He developed a knot in the L calf. Saw his dermatologist for this.     The patient's allergies, current medications, family history, past medical history, past social history, past surgical history and problem list were reviewed and updated as appropriate.    Previously (copied from previous notes for reference):   Was able to review his CPAP compliance data from 12/23/2016 through 01/21/2017 which is a total  of 30 days, during which time he used his machine 29 days with percent used days greater than 4 hours at 93%, indicating excellent compliance with an average usage of 6 hours and 57 minutes, residual AHI 1.9 per hour, pressure at 13 cm with EPR of 3, leak high with the 95th percentile at 48.9 L/m.  I saw him on 01/24/2016, at which time he reported doing well. He was compliant with CPAP therapy. He was staying busy farming blueberries. His weight had increased a little bit.    I last saw him on 07/03/2015, at which time we reviewed his test results. He reported feeling better with respect to his sleep, feeling better rested, having more daytime energy, and better sleep consolidation. He had some mouth dryness. He was using a mouthwash for that. He was compliant with treatment and commended for this as well as advised to continue regularly with CPAP therapy.    I reviewed his CPAP compliance data from 12/24/2015 through 01/22/2016 which is a total of 30 days during which time he used his machine 27 days with percent used days greater than 4 hours at 90%, indicating excellent compliance with an average usage of 7 hours and 7 minutes, residual AHI low at 1.5 per hour, leak for the most part acceptable with the 95th percentile at 24.2 L/m on a pressure of 13 cm with EPR of 3.   I first met him on 04/16/2015 at the request of his primary care physician, at which time the patient reported snoring and excessive  daytime somnolence. I invited him back for sleep study. He had a split-night sleep study on 04/26/2015 underwent over his test results with him in detail today. His baseline sleep efficiency was only 28.4% with a latency to sleep of 218 minutes and wake after sleep onset of 40.5 minutes. He had a markedly elevated arousal index secondary to respiratory events. He had severe PLMS at 93.7 per hour with an associated arousal index of 5.3 per hour. Mild to moderate snoring was noted. Total AHI was 35.7 per  hour. Baseline oxygen saturation was 92%, nadir was 86%. He was then titrated on CPAP. Sleep efficiency was improved at 88.6%. Arousal index was improved. REM sleep was increased at 30.5%, indicating REM rebound. Average oxygen saturation was 95%, nadir was 85%. He had moderate PLMS with minimal arousals. CPAP was titrated from 5-13 cm. AHI was 0 per hour at the final pressure. Based on the test results are prescribed CPAP therapy for home use.    I reviewed his CPAP compliance data from 06/02/2015 through 07/01/2015 which is a total of 30 days during which time he used his machine every night with the exception of one night, with percent used days greater than 4 hours at 97% indicating excellent compliance with an average usage of 7 hours and 52 minutes, residual AHI at 3.1 per hour, leak low with the 95th percentile at 10.5 L/m on a pressure of 13 cm with EPR of 3.   He reports snoring and excessive daytime somnolence. I reviewed your office note from 04/13/2015 which you kindly included. He is currently on Concerta 54 mg daily. In addition, he is on atorvastatin, baby aspirin, Synthroid, omega-3, lisinopril-hydrochlorothiazide and Lexapro. I reviewed laboratory test results from 02/09/2015 which you kindly included. Vitamin D was mildly low at 22.1, vitamin B12 was 386, CMP was unremarkable, total cholesterol 181, LDL 111, HDL 43, TSH 2.16, urinalysis negative.    He reports lack of energy, daytime somnolence, not feeling rested when he first wakes up. He tries to keep of sleep and wake schedule, he denies morning headaches, he has been told that not only he snores but he has witnessed pauses in his breathing according to his wife. He drinks a lot of caffeine in the form of coffee which is brewed half-and-half but he can have up to 6 cups in the morning. He usually stops drinking coffee by 11 AM. He does not drink it all day long. He drinks alcohol approximately 1 drink per day. He smoked cigars years ago.  He was never a cigarette smoker. He had a tonsillectomy and adenoidectomy at age 59. He has a strong suspicion that his father had severe obstructive sleep apnea. 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 He does not drink sodas regularly. He is a restless sleeper but denies restless leg symptoms and is not aware of any leg twitching at night. He's had weight gain for the past year. Symptoms with his sleep have been more noticeable in the past year. His Epworth sleepiness score is 15 and fatigue scores 47 today.   He had fractured his nose in the distant past. He had rhinoplasty in the 76s. He then had 2 more no surgeries. Recently he tried Afrin for nasal decongestion but is aware that he is not supposed to use it on a regular basis. Flonase did not help.   He has  nocturia once or twice per night on average.  His Past Medical History Is Significant For: Past Medical History:  Diagnosis Date  . Benign localized prostatic hyperplasia with lower urinary tract symptoms (LUTS)   . Bladder cancer ([place]) urolgoist-  dr Jeffie Pollock  . History of adenomatous polyp of colon    2002  tubular adenoma  . HOH (hard of hearing)   . Hyperlipidemia   . Hypothyroidism   . OAB (overactive bladder)   . OSA on CPAP    severe osa per study 05/ 2016  . Wears glasses     His Past Surgical History Is Significant For: Past Surgical History:  Procedure Laterality Date  . APPENDECTOMY    . CYSTOSCOPY WITH RETROGRADE PYELOGRAM, URETEROSCOPY AND STENT PLACEMENT Right 07/07/2017   Procedure: CYSTOSCOPY;  Surgeon: Irine Seal, MD;  Location: [facility];  Service: Urology;  Laterality: Right;  . CYSTOSCOPY WITH STENT PLACEMENT Right 05/28/2017   Procedure: CYSTOSCOPY;  Surgeon: Irine Seal, MD;  Location: [facility];  Service: Urology;  Laterality: Right;  . KNEE ARTHROSCOPY Right    w mcl  repair  . LUMBAR MICRODISCECTOMY  05-28-2000;  12-09-2002   L4--5 (05-28-2000)/  L5--S1 (12-09-2002)  . RHINOPLASTY    . TRANSURETHRAL RESECTION OF BLADDER TUMOR N/A 05/28/2017   Procedure: TRANSURETHRAL RESECTION OF BLADDER TUMOR (TURBT);  Surgeon: Irine Seal, MD;  Location: [facility];  Service: Urology;  Laterality: N/A;  . TRANSURETHRAL RESECTION OF BLADDER TUMOR N/A 07/07/2017   Procedure: RESTAGING TRANSURETHRAL RESECTION OF BLADDER TUMOR (TURBT);  Surgeon: Irine Seal, MD;  Location: [facility];  Service: Urology;  Laterality: N/A;    His Family History Is Significant For: Family History  Problem Relation Age of Onset  . Colon cancer Mother   . Heart murmur Mother   . Colon cancer Father   . Diabetes Father     His Social History Is Significant For: Social History   Socioeconomic History  . Marital status: Married    Spouse name: None  . Number of children: 1  . Years of education: BS   . Highest education level: None  Social Needs  . Financial resource strain: None  . Food insecurity - worry: None  . Food insecurity - inability: None  . Transportation needs - medical: None  . Transportation needs - non-medical: None  Occupational History  . Occupation: Retired  Tobacco Use  . Smoking status: Former Smoker    Last attempt to quit: 05/20/1997    Years since quitting: 20.6  . Smokeless tobacco: Never Used  . Tobacco comment: Quit in 1980' s  Substance and Sexual Activity  . Alcohol use: Yes    Alcohol/week: 1.8 oz    Types: 3 Cans of beer per week  . Drug use: No  . Sexual activity: None  Other Topics Concern  . None  Social History Narrative   Daily consumes coffee, tea, and some soda     His Allergies Are:  No Known Allergies:   His Current Medications Are:  Outpatient Encounter Medications as of 01/25/2018  Medication Sig  . aspirin 81 MG tablet Take 81 mg by mouth daily.  . ATORVASTATIN CALCIUM PO Take 40 mg by mouth  every morning.   . cholecalciferol (VITAMIN D) 1000 UNITS tablet Take 2,000 Units by mouth daily.  Marland Kitchen escitalopram (LEXAPRO) 20 MG tablet Take 10 mg by mouth every morning. Pt taking 1/2 tab daily  . Levothyroxine Sodium (SYNTHROID  PO) Take 137 mcg by mouth every morning.   Marland Kitchen lisinopril-hydrochlorothiazide (PRINZIDE,ZESTORETIC) 20-12.5 MG per tablet Take 1 tablet by mouth every morning.   . methylphenidate (CONCERTA) 54 MG CR tablet Take 54 mg by mouth every morning.  . [DISCONTINUED] HYDROcodone-acetaminophen (NORCO) 5-325 MG tablet Take 1 tablet by mouth every 6 (six) hours as needed for moderate pain.   No facility-administered encounter medications on file as of 01/25/2018.   :  Review of Systems:  Out of a complete 14 point review of systems, all are reviewed and negative with the exception of these symptoms as listed below: Review of Systems  Neurological:       Pt presents today to discuss his cpap. Pt has a high mask leak and is unsure of why it is leaking. Pt got a new mask 3 weeks ago.     Objective:  Neurological Exam  Physical Exam Physical Examination:   Vitals:   01/25/18 1147  BP: 119/71  Pulse: 88   General Examination: The patient is a very pleasant 67 y.o. male in no acute distress. He appears well-developed and well-nourished and well groomed.   HEENT: Normocephalic, atraumatic, pupils are equal, round and reactive to light and accommodation. Extraocular tracking is good without limitation to gaze excursion or nystagmus noted. Normal smooth pursuit is noted. Hearing is grossly intact. Face is symmetric with normal facial animation and normal facial sensation. Speech is clear with no dysarthria noted. There is no hypophonia. There is no lip, neck/head, jaw or voice tremor. Neck is supple with full range of active motion. Oropharynx exam reveals: mild mouth dryness, good dental hygiene and mild airway crowding. Mallampati is class II. Tongue protrudes centrally and  palate elevates symmetrically. Tonsils are absent.   Chest: Clear to auscultation without wheezing, rhonchi or crackles noted.  Heart: S1+S2+0, regular and normal without murmurs, rubs or gallops noted.   Abdomen: Soft, non-tender and non-distended with normal bowel sounds appreciated on auscultation.  Extremities: There is trace pitting edema in the distal lower extremities bilaterally. Pedal pulses are intact.  Skin: Warm and dry without trophic changes noted.  Musculoskeletal: exam reveals no obvious joint deformities, tenderness or joint swelling or erythema.   Neurologically:  Mental status: The patient is awake, alert and oriented in all 4 spheres. His immediate and remote memory, attention, language skills and fund of knowledge are appropriate. There is no evidence of aphasia, agnosia, apraxia or anomia. Speech is clear with normal prosody and enunciation. Thought process is linear. Mood is normal and affect is normal.  Cranial nerves II - XII are as described above under HEENT exam. In addition: shoulder shrug is normal with equal shoulder height noted. Motor exam: Normal bulk, strength and tone is noted. There is no drift, tremor or rebound. Reflexes are 1-2+ throughout. Fine motor skills and coordination: grossly intact.   Cerebellar testing: No dysmetria or intention tremor. There is no truncal or gait ataxia.  Sensory exam: intact to light touch in the upper and lower extremities.  Gait, station and balance: He stands easily. No veering to one side is noted. No leaning to one side is noted. Posture is age-appropriate and stance is narrow based. Gait shows normal stride length and normal pace. No problems turning are noted.   Assessment and Plan:  In summary, [name] is a very pleasant 67 year old male with an underlying medical history of ADD, reflux disease, hyperlipidemia, hypothyroidism, hypertension, diverticulosis, allergic rhinitis, obesity, depression and  anxiety, who presents  for follow-up consultation of his OSA for his yearly check up. He continues to be compliant with CPAP. Of note, he had a split-night sleep study in May 2016. He is commended for his treatment adherence. He is advised to follow-up in one year routinely. I answered all his questions today and he was in agreement.  I spent 20 minutes in total face-to-face time with the patient, more than 50% of which was spent in counseling and coordination of care, reviewing test results, reviewing medication and discussing or reviewing the diagnosis of OSA, its prognosis and treatment options. Pertinent laboratory and imaging test results that were available during this visit with the patient were reviewed by me and considered in my medical decision making (see chart for details).

## 2018-01-26 ENCOUNTER — Telehealth: Payer: Self-pay

## 2018-01-26 NOTE — Telephone Encounter (Signed)
LM for patient to call sleep lab to make an appointment for a mask fitting

## 2018-02-01 ENCOUNTER — Telehealth: Payer: Self-pay

## 2018-02-01 NOTE — Telephone Encounter (Signed)
Patient came for a mask fitting. He currently wears F&P medium isom. Patient states he opens his mouth at night which could be the cause of the high leak. I fitted him with the F&P full face mask. Simplus large. Medium was too small. He liked this mask and felt comfortable with it. Gave him one to take home and try. He will let me know in a couple weeks how he is doing.

## 2018-02-10 DIAGNOSIS — A311 Cutaneous mycobacterial infection: Secondary | ICD-10-CM | POA: Diagnosis not present

## 2018-02-11 DIAGNOSIS — Z8551 Personal history of malignant neoplasm of bladder: Secondary | ICD-10-CM | POA: Diagnosis not present

## 2018-02-15 DIAGNOSIS — R7309 Other abnormal glucose: Secondary | ICD-10-CM | POA: Diagnosis not present

## 2018-02-15 DIAGNOSIS — C678 Malignant neoplasm of overlapping sites of bladder: Secondary | ICD-10-CM | POA: Diagnosis not present

## 2018-02-15 DIAGNOSIS — R82998 Other abnormal findings in urine: Secondary | ICD-10-CM | POA: Diagnosis not present

## 2018-02-15 DIAGNOSIS — Z5111 Encounter for antineoplastic chemotherapy: Secondary | ICD-10-CM | POA: Diagnosis not present

## 2018-02-15 DIAGNOSIS — I10 Essential (primary) hypertension: Secondary | ICD-10-CM | POA: Diagnosis not present

## 2018-02-15 DIAGNOSIS — Z125 Encounter for screening for malignant neoplasm of prostate: Secondary | ICD-10-CM | POA: Diagnosis not present

## 2018-02-15 DIAGNOSIS — E7849 Other hyperlipidemia: Secondary | ICD-10-CM | POA: Diagnosis not present

## 2018-02-15 DIAGNOSIS — E038 Other specified hypothyroidism: Secondary | ICD-10-CM | POA: Diagnosis not present

## 2018-02-22 DIAGNOSIS — C678 Malignant neoplasm of overlapping sites of bladder: Secondary | ICD-10-CM | POA: Diagnosis not present

## 2018-02-22 DIAGNOSIS — Z5111 Encounter for antineoplastic chemotherapy: Secondary | ICD-10-CM | POA: Diagnosis not present

## 2018-03-02 DIAGNOSIS — R350 Frequency of micturition: Secondary | ICD-10-CM | POA: Diagnosis not present

## 2018-03-02 DIAGNOSIS — Z8551 Personal history of malignant neoplasm of bladder: Secondary | ICD-10-CM | POA: Diagnosis not present

## 2018-03-02 DIAGNOSIS — R310 Gross hematuria: Secondary | ICD-10-CM | POA: Diagnosis not present

## 2018-03-08 DIAGNOSIS — D414 Neoplasm of uncertain behavior of bladder: Secondary | ICD-10-CM | POA: Diagnosis not present

## 2018-03-08 DIAGNOSIS — E668 Other obesity: Secondary | ICD-10-CM | POA: Diagnosis not present

## 2018-03-08 DIAGNOSIS — Z1389 Encounter for screening for other disorder: Secondary | ICD-10-CM | POA: Diagnosis not present

## 2018-03-08 DIAGNOSIS — G4733 Obstructive sleep apnea (adult) (pediatric): Secondary | ICD-10-CM | POA: Diagnosis not present

## 2018-03-08 DIAGNOSIS — E7849 Other hyperlipidemia: Secondary | ICD-10-CM | POA: Diagnosis not present

## 2018-03-08 DIAGNOSIS — I10 Essential (primary) hypertension: Secondary | ICD-10-CM | POA: Diagnosis not present

## 2018-03-08 DIAGNOSIS — Z0000 Encounter for general adult medical examination without abnormal findings: Secondary | ICD-10-CM | POA: Diagnosis not present

## 2018-03-08 DIAGNOSIS — M25561 Pain in right knee: Secondary | ICD-10-CM | POA: Diagnosis not present

## 2018-03-08 DIAGNOSIS — R7309 Other abnormal glucose: Secondary | ICD-10-CM | POA: Diagnosis not present

## 2018-03-08 DIAGNOSIS — F418 Other specified anxiety disorders: Secondary | ICD-10-CM | POA: Diagnosis not present

## 2018-03-08 DIAGNOSIS — I2510 Atherosclerotic heart disease of native coronary artery without angina pectoris: Secondary | ICD-10-CM | POA: Diagnosis not present

## 2018-03-08 DIAGNOSIS — Z6831 Body mass index (BMI) 31.0-31.9, adult: Secondary | ICD-10-CM | POA: Diagnosis not present

## 2018-03-09 DIAGNOSIS — Z5111 Encounter for antineoplastic chemotherapy: Secondary | ICD-10-CM | POA: Diagnosis not present

## 2018-03-09 DIAGNOSIS — C678 Malignant neoplasm of overlapping sites of bladder: Secondary | ICD-10-CM | POA: Diagnosis not present

## 2018-04-08 DIAGNOSIS — M25562 Pain in left knee: Secondary | ICD-10-CM | POA: Diagnosis not present

## 2018-04-08 DIAGNOSIS — M1712 Unilateral primary osteoarthritis, left knee: Secondary | ICD-10-CM | POA: Diagnosis not present

## 2018-04-15 DIAGNOSIS — M25562 Pain in left knee: Secondary | ICD-10-CM | POA: Diagnosis not present

## 2018-05-10 DIAGNOSIS — Z8551 Personal history of malignant neoplasm of bladder: Secondary | ICD-10-CM | POA: Diagnosis not present

## 2018-05-10 DIAGNOSIS — R972 Elevated prostate specific antigen [PSA]: Secondary | ICD-10-CM | POA: Diagnosis not present

## 2018-05-25 DIAGNOSIS — M94262 Chondromalacia, left knee: Secondary | ICD-10-CM | POA: Diagnosis not present

## 2018-05-25 DIAGNOSIS — G8918 Other acute postprocedural pain: Secondary | ICD-10-CM | POA: Diagnosis not present

## 2018-05-25 DIAGNOSIS — M94212 Chondromalacia, left shoulder: Secondary | ICD-10-CM | POA: Diagnosis not present

## 2018-05-25 DIAGNOSIS — M23232 Derangement of other medial meniscus due to old tear or injury, left knee: Secondary | ICD-10-CM | POA: Diagnosis not present

## 2018-05-25 DIAGNOSIS — S83242A Other tear of medial meniscus, current injury, left knee, initial encounter: Secondary | ICD-10-CM | POA: Diagnosis not present

## 2018-06-08 DIAGNOSIS — M25562 Pain in left knee: Secondary | ICD-10-CM | POA: Diagnosis not present

## 2018-06-15 DIAGNOSIS — M25562 Pain in left knee: Secondary | ICD-10-CM | POA: Diagnosis not present

## 2018-06-18 DIAGNOSIS — M25562 Pain in left knee: Secondary | ICD-10-CM | POA: Diagnosis not present

## 2018-06-22 DIAGNOSIS — M25562 Pain in left knee: Secondary | ICD-10-CM | POA: Diagnosis not present

## 2018-06-25 DIAGNOSIS — M25562 Pain in left knee: Secondary | ICD-10-CM | POA: Diagnosis not present

## 2018-06-28 DIAGNOSIS — H2513 Age-related nuclear cataract, bilateral: Secondary | ICD-10-CM | POA: Diagnosis not present

## 2018-06-28 DIAGNOSIS — H43813 Vitreous degeneration, bilateral: Secondary | ICD-10-CM | POA: Diagnosis not present

## 2018-06-28 DIAGNOSIS — H5203 Hypermetropia, bilateral: Secondary | ICD-10-CM | POA: Diagnosis not present

## 2018-06-28 DIAGNOSIS — H52203 Unspecified astigmatism, bilateral: Secondary | ICD-10-CM | POA: Diagnosis not present

## 2018-08-11 DIAGNOSIS — R972 Elevated prostate specific antigen [PSA]: Secondary | ICD-10-CM | POA: Diagnosis not present

## 2018-08-18 DIAGNOSIS — R972 Elevated prostate specific antigen [PSA]: Secondary | ICD-10-CM | POA: Diagnosis not present

## 2018-08-18 DIAGNOSIS — Z8551 Personal history of malignant neoplasm of bladder: Secondary | ICD-10-CM | POA: Diagnosis not present

## 2018-09-08 DIAGNOSIS — D2262 Melanocytic nevi of left upper limb, including shoulder: Secondary | ICD-10-CM | POA: Diagnosis not present

## 2018-09-08 DIAGNOSIS — L821 Other seborrheic keratosis: Secondary | ICD-10-CM | POA: Diagnosis not present

## 2018-09-08 DIAGNOSIS — D225 Melanocytic nevi of trunk: Secondary | ICD-10-CM | POA: Diagnosis not present

## 2018-09-08 DIAGNOSIS — L814 Other melanin hyperpigmentation: Secondary | ICD-10-CM | POA: Diagnosis not present

## 2018-09-08 DIAGNOSIS — L820 Inflamed seborrheic keratosis: Secondary | ICD-10-CM | POA: Diagnosis not present

## 2018-09-08 DIAGNOSIS — D1801 Hemangioma of skin and subcutaneous tissue: Secondary | ICD-10-CM | POA: Diagnosis not present

## 2018-09-08 DIAGNOSIS — L810 Postinflammatory hyperpigmentation: Secondary | ICD-10-CM | POA: Diagnosis not present

## 2018-09-08 DIAGNOSIS — L570 Actinic keratosis: Secondary | ICD-10-CM | POA: Diagnosis not present

## 2018-09-08 DIAGNOSIS — L918 Other hypertrophic disorders of the skin: Secondary | ICD-10-CM | POA: Diagnosis not present

## 2018-09-08 DIAGNOSIS — D2362 Other benign neoplasm of skin of left upper limb, including shoulder: Secondary | ICD-10-CM | POA: Diagnosis not present

## 2018-09-13 DIAGNOSIS — C678 Malignant neoplasm of overlapping sites of bladder: Secondary | ICD-10-CM | POA: Diagnosis not present

## 2018-09-13 DIAGNOSIS — E668 Other obesity: Secondary | ICD-10-CM | POA: Diagnosis not present

## 2018-09-13 DIAGNOSIS — G4733 Obstructive sleep apnea (adult) (pediatric): Secondary | ICD-10-CM | POA: Diagnosis not present

## 2018-09-13 DIAGNOSIS — R7309 Other abnormal glucose: Secondary | ICD-10-CM | POA: Diagnosis not present

## 2018-09-13 DIAGNOSIS — Z23 Encounter for immunization: Secondary | ICD-10-CM | POA: Diagnosis not present

## 2018-09-13 DIAGNOSIS — I2510 Atherosclerotic heart disease of native coronary artery without angina pectoris: Secondary | ICD-10-CM | POA: Diagnosis not present

## 2018-09-13 DIAGNOSIS — I10 Essential (primary) hypertension: Secondary | ICD-10-CM | POA: Diagnosis not present

## 2018-09-13 DIAGNOSIS — E038 Other specified hypothyroidism: Secondary | ICD-10-CM | POA: Diagnosis not present

## 2018-09-13 DIAGNOSIS — M25561 Pain in right knee: Secondary | ICD-10-CM | POA: Diagnosis not present

## 2018-09-13 DIAGNOSIS — R972 Elevated prostate specific antigen [PSA]: Secondary | ICD-10-CM | POA: Diagnosis not present

## 2018-09-13 DIAGNOSIS — D414 Neoplasm of uncertain behavior of bladder: Secondary | ICD-10-CM | POA: Diagnosis not present

## 2018-09-13 DIAGNOSIS — Z1389 Encounter for screening for other disorder: Secondary | ICD-10-CM | POA: Diagnosis not present

## 2018-09-13 DIAGNOSIS — Z6832 Body mass index (BMI) 32.0-32.9, adult: Secondary | ICD-10-CM | POA: Diagnosis not present

## 2018-09-13 DIAGNOSIS — Z5111 Encounter for antineoplastic chemotherapy: Secondary | ICD-10-CM | POA: Diagnosis not present

## 2018-09-20 DIAGNOSIS — C678 Malignant neoplasm of overlapping sites of bladder: Secondary | ICD-10-CM | POA: Diagnosis not present

## 2018-09-20 DIAGNOSIS — Z5111 Encounter for antineoplastic chemotherapy: Secondary | ICD-10-CM | POA: Diagnosis not present

## 2018-09-27 DIAGNOSIS — C678 Malignant neoplasm of overlapping sites of bladder: Secondary | ICD-10-CM | POA: Diagnosis not present

## 2018-09-27 DIAGNOSIS — Z5111 Encounter for antineoplastic chemotherapy: Secondary | ICD-10-CM | POA: Diagnosis not present

## 2018-11-15 DIAGNOSIS — Z8551 Personal history of malignant neoplasm of bladder: Secondary | ICD-10-CM | POA: Diagnosis not present

## 2019-01-23 ENCOUNTER — Encounter: Payer: Self-pay | Admitting: Neurology

## 2019-01-25 ENCOUNTER — Encounter: Payer: Self-pay | Admitting: Neurology

## 2019-01-25 ENCOUNTER — Ambulatory Visit (INDEPENDENT_AMBULATORY_CARE_PROVIDER_SITE_OTHER): Payer: Medicare Other | Admitting: Neurology

## 2019-01-25 VITALS — BP 133/83 | HR 87 | Ht 71.0 in | Wt 228.0 lb

## 2019-01-25 DIAGNOSIS — G4733 Obstructive sleep apnea (adult) (pediatric): Secondary | ICD-10-CM

## 2019-01-25 DIAGNOSIS — Z9989 Dependence on other enabling machines and devices: Secondary | ICD-10-CM | POA: Diagnosis not present

## 2019-01-25 NOTE — Patient Instructions (Signed)
Keep up the good work! FU in one year.  We will request a mask refit.

## 2019-01-25 NOTE — Progress Notes (Signed)
Subjective:    Patient ID: [name] is a 68 y.o. male.  HPI     Interim history:   [name] is a 68 year old right-handed gentleman with an underlying medical history of ADD, reflux disease, hypothyroidism, hyperlipidemia, depression, anxiety, hypertension, diverticulosis, allergic rhinitis, recent diagnosis of bladder cancer, and obesity, who presents for follow-up consultation of his obstructive sleep apnea, well established on CPAP therapy. The patient is unaccompanied today and presents for his yearly checkup. I last saw him on 01/25/2018, at which time he was compliant with his CPAP. He had interim issues with bronchitis and diagnosis of pneumonia. Is also diagnosed with bladder cancer and had undergone surgery for this in June and July 2018. He had immunotherapy with BCG after that. Overall, he had done quite well, thankfully.   Today, 01/25/2019 (all dictated new, as well as above notes, some dictation done in note pad or Word, outside of chart, may appear as copied):   I reviewed his CPAP compliance data from 12/25/2018 through 01/23/2019 which is a total of 30 days, during which time he used his machine every night with percent used days greater than 4 hours at 97%, indicating excellent compliance with an average usage of 6 hours and 37 minutes, residual AHI 4.7 per hour, leak on the high side, more so on the past 2 weeks with the 95th percentile at 38.6 L/m on a pressure of 13 cm with EPR of 3. He reports doing well. He is trying to lose weight. He's been going to the gym. He would like to maybe try nasal pillows, he has been using a fullface mask and admits that he does sleep with his mouth open sometimes. He has not been able to sleep on his back and the full face mask is uncomfortable when he tries to sleep on his sides.   The patient's allergies, current medications, family history, past medical history, past social history, past surgical history and problem list were reviewed  and updated as appropriate.    Previously (copied from previous notes for reference):   I saw him on 01/22/2017, at which time he was compliant with CPAP. He had trouble with maintaining a good feel on the mask. He also received recent replacement CPAP machine due to a defect in his old machine.   I reviewed his CPAP compliance data from 12/22/2017 through 09/19/2018 which is a total of 30 days, during which time he used his machine 29 days with percent used days greater than 4 hours at 83%, indicating very good compliance with an average usage of 6 hours and 25 minutes, residual AHI 3.5 per hour, leak high with the 95th percentile at 50.8 L/m on a pressure of 13 cm with EPR of 3.    I was able to review his CPAP compliance data from 12/23/2016 through 01/21/2017 which is a total of 30 days, during which time he used his machine 29 days with percent used days greater than 4 hours at 93%, indicating excellent compliance with an average usage of 6 hours and 57 minutes, residual AHI 1.9 per hour, pressure at 13 cm with EPR of 3, leak high with the 95th percentile at 48.9 L/m.   I saw him on 01/24/2016, at which time he reported doing well. He was compliant with CPAP therapy. He was staying busy farming blueberries. His weight had increased a little bit.   I last saw him on 07/03/2015, at which time we reviewed his test results. He reported feeling  better with respect to his sleep, feeling better rested, having more daytime energy, and better sleep consolidation. He had some mouth dryness. He was using a mouthwash for that. He was compliant with treatment and commended for this as well as advised to continue regularly with CPAP therapy.    I reviewed his CPAP compliance data from 12/24/2015 through 01/22/2016 which is a total of 30 days during which time he used his machine 27 days with percent used days greater than 4 hours at 90%, indicating excellent compliance with an average usage of 7 hours and 7  minutes, residual AHI low at 1.5 per hour, leak for the most part acceptable with the 95th percentile at 24.2 L/m on a pressure of 13 cm with EPR of 3.   I first met him on 04/16/2015 at the request of his primary care physician, at which time the patient reported snoring and excessive daytime somnolence. I invited him back for sleep study. He had a split-night sleep study on 04/26/2015 underwent over his test results with him in detail today. His baseline sleep efficiency was only 28.4% with a latency to sleep of 218 minutes and wake after sleep onset of 40.5 minutes. He had a markedly elevated arousal index secondary to respiratory events. He had severe PLMS at 93.7 per hour with an associated arousal index of 5.3 per hour. Mild to moderate snoring was noted. Total AHI was 35.7 per hour. Baseline oxygen saturation was 92%, nadir was 86%. He was then titrated on CPAP. Sleep efficiency was improved at 88.6%. Arousal index was improved. REM sleep was increased at 30.5%, indicating REM rebound. Average oxygen saturation was 95%, nadir was 85%. He had moderate PLMS with minimal arousals. CPAP was titrated from 5-13 cm. AHI was 0 per hour at the final pressure. Based on the test results are prescribed CPAP therapy for home use.    I reviewed his CPAP compliance data from 06/02/2015 through 07/01/2015 which is a total of 30 days during which time he used his machine every night with the exception of one night, with percent used days greater than 4 hours at 97% indicating excellent compliance with an average usage of 7 hours and 52 minutes, residual AHI at 3.1 per hour, leak low with the 95th percentile at 10.5 L/m on a pressure of 13 cm with EPR of 3.   He reports snoring and excessive daytime somnolence. I reviewed your office note from 04/13/2015 which you kindly included. He is currently on Concerta 54 mg daily. In addition, he is on atorvastatin, baby aspirin, Synthroid, omega-3, lisinopril-hydrochlorothiazide  and Lexapro. I reviewed laboratory test results from 02/09/2015 which you kindly included. Vitamin D was mildly low at 22.1, vitamin B12 was 386, CMP was unremarkable, total cholesterol 181, LDL 111, HDL 43, TSH 2.16, urinalysis negative.    He reports lack of energy, daytime somnolence, not feeling rested when he first wakes up. He tries to keep of sleep and wake schedule, he denies morning headaches, he has been told that not only he snores but he has witnessed pauses in his breathing according to his wife. He drinks a lot of caffeine in the form of coffee which is brewed half-and-half but he can have up to 6 cups in the morning. He usually stops drinking coffee by 11 AM. He does not drink it all day long. He drinks alcohol approximately 1 drink per day. He smoked cigars years ago. He was never a cigarette smoker. He had a tonsillectomy and  adenoidectomy at age 15. He has a strong suspicion that his father had severe obstructive sleep apnea. 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 He does not drink sodas regularly. He is a restless sleeper but denies restless leg symptoms and is not aware of any leg twitching at night. He's had weight gain for the past year. Symptoms with his sleep have been more noticeable in the past year. His Epworth sleepiness score is 15 and fatigue scores 47 today.   He had fractured his nose in the distant past. He had rhinoplasty in the 51s. He then had 2 more no surgeries. Recently he tried Afrin for nasal decongestion but is aware that he is not supposed to use it on a regular basis. Flonase did not help.   He has nocturia once or twice per night on average.  His Past Medical History Is Significant For: Past Medical History:  Diagnosis Date  . Benign localized prostatic hyperplasia with lower urinary tract symptoms (LUTS)   . Bladder cancer ([place]) urolgoist-  dr Jeffie Pollock  .  History of adenomatous polyp of colon    2002  tubular adenoma  . HOH (hard of hearing)   . Hyperlipidemia   . Hypothyroidism   . OAB (overactive bladder)   . OSA on CPAP    severe osa per study 05/ 2016  . Wears glasses     His Past Surgical History Is Significant For: Past Surgical History:  Procedure Laterality Date  . APPENDECTOMY    . CYSTOSCOPY WITH RETROGRADE PYELOGRAM, URETEROSCOPY AND STENT PLACEMENT Right 07/07/2017   Procedure: CYSTOSCOPY;  Surgeon: Irine Seal, MD;  Location: [facility];  Service: Urology;  Laterality: Right;  . CYSTOSCOPY WITH STENT PLACEMENT Right 05/28/2017   Procedure: CYSTOSCOPY;  Surgeon: Irine Seal, MD;  Location: [facility];  Service: Urology;  Laterality: Right;  . KNEE ARTHROSCOPY Right    w mcl repair  . LUMBAR MICRODISCECTOMY  05-28-2000;  12-09-2002   L4--5 (05-28-2000)/  L5--S1 (12-09-2002)  . RHINOPLASTY    . TRANSURETHRAL RESECTION OF BLADDER TUMOR N/A 05/28/2017   Procedure: TRANSURETHRAL RESECTION OF BLADDER TUMOR (TURBT);  Surgeon: Irine Seal, MD;  Location: [facility];  Service: Urology;  Laterality: N/A;  . TRANSURETHRAL RESECTION OF BLADDER TUMOR N/A 07/07/2017   Procedure: RESTAGING TRANSURETHRAL RESECTION OF BLADDER TUMOR (TURBT);  Surgeon: Irine Seal, MD;  Location: [facility];  Service: Urology;  Laterality: N/A;    His Family History Is Significant For: Family History  Problem Relation Age of Onset  . Colon cancer Mother   . Heart murmur Mother   . Colon cancer Father   . Diabetes Father     His Social History Is Significant For: Social History   Socioeconomic History  . Marital status: Married    Spouse name: Not on file  . Number of children: 1  . Years of education: BS   . Highest education level: Not on file  Occupational History  . Occupation: Retired  Scientific laboratory technician  . Financial resource strain: Not on file  . Food insecurity:    Worry: Not on  file    Inability: Not on file  . Transportation needs:    Medical: Not on file    Non-medical: Not on file  Tobacco Use  . Smoking status: Former Smoker    Last attempt to  quit: 05/20/1997    Years since quitting: 21.6  . Smokeless tobacco: Never Used  . Tobacco comment: Quit in 1980' s  Substance and Sexual Activity  . Alcohol use: Yes    Alcohol/week: 3.0 standard drinks    Types: 3 Cans of beer per week  . Drug use: No  . Sexual activity: Not on file  Lifestyle  . Physical activity:    Days per week: Not on file    Minutes per session: Not on file  . Stress: Not on file  Relationships  . Social connections:    Talks on phone: Not on file    Gets together: Not on file    Attends religious service: Not on file    Active member of club or organization: Not on file    Attends meetings of clubs or organizations: Not on file    Relationship status: Not on file  Other Topics Concern  . Not on file  Social History Narrative   Daily consumes coffee, tea, and some soda     His Allergies Are:  No Known Allergies:   His Current Medications Are:  Outpatient Encounter Medications as of 01/25/2019  Medication Sig  . aspirin 81 MG tablet Take 81 mg by mouth daily.  . ATORVASTATIN CALCIUM PO Take 40 mg by mouth every morning.   . cholecalciferol (VITAMIN D) 1000 UNITS tablet Take 2,000 Units by mouth daily.  Marland Kitchen escitalopram (LEXAPRO) 20 MG tablet Take 10 mg by mouth every morning. Pt taking 1/2 tab daily  . Levothyroxine Sodium (SYNTHROID PO) Take 137 mcg by mouth every morning.   Marland Kitchen lisinopril-hydrochlorothiazide (PRINZIDE,ZESTORETIC) 20-12.5 MG per tablet Take 1 tablet by mouth every morning.   . methylphenidate (CONCERTA) 54 MG CR tablet Take 54 mg by mouth every morning.   No facility-administered encounter medications on file as of 01/25/2019.   :  Review of Systems:  Out of a complete 14 point review of systems, all are reviewed and negative with the exception of these  symptoms as listed below: Review of Systems  Neurological:       Pt presents today to discuss his cpap. Pt reports that he is interested in a new mask.    Objective:  Neurological Exam  Physical Exam Physical Examination:   Vitals:   01/25/19 1027  BP: 133/83  Pulse: 87   General Examination: The patient is a very pleasant 68 y.o. male in no acute distress. He appears well-developed and well-nourished and well groomed. Good spirits.   HEENT:Normocephalic, atraumatic, pupils are equal, round and reactive to light and accommodation. Extraocular tracking is good without limitation to gaze excursion or nystagmus noted. Hearing is grossly intact. Face is symmetric with normal facial animation and normal facial sensation. Speech is clear with no dysarthria noted. There is no hypophonia. There is no lip, neck/head, jaw or voice tremor. Neck shows FROM. Oropharynx exam reveals: mildmouth dryness, gooddental hygiene and mildairway crowding. Tongue protrudes centrally and palate elevates symmetrically.   Chest:Clear to auscultation without wheezing, rhonchi or crackles noted.  Heart:S1+S2+0, regular and normal without murmurs, rubs or gallops noted.   Abdomen:Soft, non-tender and non-distended.   Extremities:There istracepitting edema in the distal lower extremities bilaterally. Pedal pulses are intact.  Skin: Warm and dry without trophic changes noted.  Musculoskeletal: exam reveals no obvious joint deformities, tenderness or joint swelling or erythema.  Neurologically:  Mental status: The patient is awake, alert and oriented in all 4 spheres.Hisimmediate and remote memory, attention, language skills  and fund of knowledge are appropriate. There is no evidence of aphasia, agnosia, apraxia or anomia. Speech is clear with normal prosody and enunciation. Thought process is linear. Mood is normaland affect is normal.  Cranial nerves II - XII are as described above under  HEENT exam.  Motor exam: Normal bulk, strength and tone is noted. There is no tremor, Romberg neg. Fine motor skills and coordination: grossly intact. Cerebellar testing: No dysmetria or intention tremor. There is no truncal or gait ataxia.  Sensory exam: intact to light touch in the upper and lower extremities.  Gait, station and balance:Hestands easily. No veering to one side is noted. No leaning to one side is noted. Posture is age-appropriate and stance is narrow based. Gait showsnormalstride length and normalpace. No problems turning are noted. Tandem walk is unremarkable.  Assessmentand Plan:  In summary,Eric B Hayesis a very pleasant 57 year oldmalewith an underlying medical history of ADD, reflux disease, hyperlipidemia, hypothyroidism, hypertension, diverticulosis, allergic rhinitis, obesity, depression and anxiety, who presents for follow-up consultation of his OSA for his yearly check up. He continues to be compliant with CPAP. Of note, he had a split-night sleep study in May 2016. He is commended for his ongoing treatment adherence and reports ongoing good results. He would like to try a different nasal interface, nasal pillows of possible. I requested a mask refit through his DME company. Because of his beard his full facemask does leak consistently. A nasal interface will likely make it easier for him to sleep on his sides. From my end of things I suggested a one-year checkup. He is typically up-to-date with his supplies. I answered all his questions today and he was in agreement. I spent 25 minutes in total face-to-face time with the patient, more than 50% of which was spent in counseling and coordination of care, reviewing test results, reviewing medication and discussing or reviewing the diagnosis of OSA, its prognosis and treatment options. Pertinent laboratory and imaging test results that were available during this visit with the patient were reviewed by me and considered  in my medical decision making (see chart for details).

## 2019-02-16 DIAGNOSIS — Z8551 Personal history of malignant neoplasm of bladder: Secondary | ICD-10-CM | POA: Diagnosis not present

## 2019-03-04 DIAGNOSIS — E7849 Other hyperlipidemia: Secondary | ICD-10-CM | POA: Diagnosis not present

## 2019-03-04 DIAGNOSIS — E559 Vitamin D deficiency, unspecified: Secondary | ICD-10-CM | POA: Diagnosis not present

## 2019-03-04 DIAGNOSIS — E038 Other specified hypothyroidism: Secondary | ICD-10-CM | POA: Diagnosis not present

## 2019-03-04 DIAGNOSIS — R739 Hyperglycemia, unspecified: Secondary | ICD-10-CM | POA: Diagnosis not present

## 2019-03-04 DIAGNOSIS — Z125 Encounter for screening for malignant neoplasm of prostate: Secondary | ICD-10-CM | POA: Diagnosis not present

## 2019-03-04 DIAGNOSIS — I10 Essential (primary) hypertension: Secondary | ICD-10-CM | POA: Diagnosis not present

## 2019-03-11 DIAGNOSIS — R739 Hyperglycemia, unspecified: Secondary | ICD-10-CM | POA: Diagnosis not present

## 2019-03-11 DIAGNOSIS — Z0000 Encounter for general adult medical examination without abnormal findings: Secondary | ICD-10-CM | POA: Diagnosis not present

## 2019-03-11 DIAGNOSIS — G4733 Obstructive sleep apnea (adult) (pediatric): Secondary | ICD-10-CM | POA: Diagnosis not present

## 2019-03-11 DIAGNOSIS — E669 Obesity, unspecified: Secondary | ICD-10-CM | POA: Diagnosis not present

## 2019-03-11 DIAGNOSIS — F900 Attention-deficit hyperactivity disorder, predominantly inattentive type: Secondary | ICD-10-CM | POA: Diagnosis not present

## 2019-03-11 DIAGNOSIS — D414 Neoplasm of uncertain behavior of bladder: Secondary | ICD-10-CM | POA: Diagnosis not present

## 2019-03-11 DIAGNOSIS — I2510 Atherosclerotic heart disease of native coronary artery without angina pectoris: Secondary | ICD-10-CM | POA: Diagnosis not present

## 2019-03-11 DIAGNOSIS — R972 Elevated prostate specific antigen [PSA]: Secondary | ICD-10-CM | POA: Diagnosis not present

## 2019-03-11 DIAGNOSIS — I10 Essential (primary) hypertension: Secondary | ICD-10-CM | POA: Diagnosis not present

## 2019-03-11 DIAGNOSIS — F419 Anxiety disorder, unspecified: Secondary | ICD-10-CM | POA: Diagnosis not present

## 2019-03-11 DIAGNOSIS — E039 Hypothyroidism, unspecified: Secondary | ICD-10-CM | POA: Diagnosis not present

## 2019-05-11 DIAGNOSIS — R972 Elevated prostate specific antigen [PSA]: Secondary | ICD-10-CM | POA: Diagnosis not present

## 2019-05-18 DIAGNOSIS — R350 Frequency of micturition: Secondary | ICD-10-CM | POA: Diagnosis not present

## 2019-05-18 DIAGNOSIS — N401 Enlarged prostate with lower urinary tract symptoms: Secondary | ICD-10-CM | POA: Diagnosis not present

## 2019-05-18 DIAGNOSIS — R972 Elevated prostate specific antigen [PSA]: Secondary | ICD-10-CM | POA: Diagnosis not present

## 2019-05-18 DIAGNOSIS — Z8551 Personal history of malignant neoplasm of bladder: Secondary | ICD-10-CM | POA: Diagnosis not present

## 2019-05-24 DIAGNOSIS — Z5111 Encounter for antineoplastic chemotherapy: Secondary | ICD-10-CM | POA: Diagnosis not present

## 2019-05-24 DIAGNOSIS — C678 Malignant neoplasm of overlapping sites of bladder: Secondary | ICD-10-CM | POA: Diagnosis not present

## 2019-07-04 DIAGNOSIS — H43813 Vitreous degeneration, bilateral: Secondary | ICD-10-CM | POA: Diagnosis not present

## 2019-07-04 DIAGNOSIS — H02054 Trichiasis without entropian left upper eyelid: Secondary | ICD-10-CM | POA: Diagnosis not present

## 2019-07-04 DIAGNOSIS — H25812 Combined forms of age-related cataract, left eye: Secondary | ICD-10-CM | POA: Diagnosis not present

## 2019-07-04 DIAGNOSIS — H5203 Hypermetropia, bilateral: Secondary | ICD-10-CM | POA: Diagnosis not present

## 2019-08-09 DIAGNOSIS — D485 Neoplasm of uncertain behavior of skin: Secondary | ICD-10-CM | POA: Diagnosis not present

## 2019-08-09 DIAGNOSIS — C44722 Squamous cell carcinoma of skin of right lower limb, including hip: Secondary | ICD-10-CM | POA: Diagnosis not present

## 2019-08-09 DIAGNOSIS — D225 Melanocytic nevi of trunk: Secondary | ICD-10-CM | POA: Diagnosis not present

## 2019-08-09 DIAGNOSIS — L820 Inflamed seborrheic keratosis: Secondary | ICD-10-CM | POA: Diagnosis not present

## 2019-08-24 DIAGNOSIS — Z8551 Personal history of malignant neoplasm of bladder: Secondary | ICD-10-CM | POA: Diagnosis not present

## 2019-08-24 DIAGNOSIS — N401 Enlarged prostate with lower urinary tract symptoms: Secondary | ICD-10-CM | POA: Diagnosis not present

## 2019-08-24 DIAGNOSIS — R350 Frequency of micturition: Secondary | ICD-10-CM | POA: Diagnosis not present

## 2019-08-24 DIAGNOSIS — R972 Elevated prostate specific antigen [PSA]: Secondary | ICD-10-CM | POA: Diagnosis not present

## 2019-09-12 DIAGNOSIS — R739 Hyperglycemia, unspecified: Secondary | ICD-10-CM | POA: Diagnosis not present

## 2019-09-12 DIAGNOSIS — D414 Neoplasm of uncertain behavior of bladder: Secondary | ICD-10-CM | POA: Diagnosis not present

## 2019-09-12 DIAGNOSIS — F419 Anxiety disorder, unspecified: Secondary | ICD-10-CM | POA: Diagnosis not present

## 2019-09-12 DIAGNOSIS — E039 Hypothyroidism, unspecified: Secondary | ICD-10-CM | POA: Diagnosis not present

## 2019-09-12 DIAGNOSIS — E785 Hyperlipidemia, unspecified: Secondary | ICD-10-CM | POA: Diagnosis not present

## 2019-09-12 DIAGNOSIS — E669 Obesity, unspecified: Secondary | ICD-10-CM | POA: Diagnosis not present

## 2019-09-12 DIAGNOSIS — G4733 Obstructive sleep apnea (adult) (pediatric): Secondary | ICD-10-CM | POA: Diagnosis not present

## 2019-09-12 DIAGNOSIS — E559 Vitamin D deficiency, unspecified: Secondary | ICD-10-CM | POA: Diagnosis not present

## 2019-09-12 DIAGNOSIS — C4492 Squamous cell carcinoma of skin, unspecified: Secondary | ICD-10-CM | POA: Diagnosis not present

## 2019-09-12 DIAGNOSIS — I10 Essential (primary) hypertension: Secondary | ICD-10-CM | POA: Diagnosis not present

## 2019-09-12 DIAGNOSIS — R972 Elevated prostate specific antigen [PSA]: Secondary | ICD-10-CM | POA: Diagnosis not present

## 2019-09-12 DIAGNOSIS — I2510 Atherosclerotic heart disease of native coronary artery without angina pectoris: Secondary | ICD-10-CM | POA: Diagnosis not present

## 2019-09-13 ENCOUNTER — Other Ambulatory Visit: Payer: Self-pay

## 2019-09-13 DIAGNOSIS — Z20828 Contact with and (suspected) exposure to other viral communicable diseases: Secondary | ICD-10-CM | POA: Diagnosis not present

## 2019-09-13 DIAGNOSIS — Z20822 Contact with and (suspected) exposure to covid-19: Secondary | ICD-10-CM

## 2019-09-13 DIAGNOSIS — E7849 Other hyperlipidemia: Secondary | ICD-10-CM | POA: Diagnosis not present

## 2019-09-13 DIAGNOSIS — E038 Other specified hypothyroidism: Secondary | ICD-10-CM | POA: Diagnosis not present

## 2019-09-13 DIAGNOSIS — Z23 Encounter for immunization: Secondary | ICD-10-CM | POA: Diagnosis not present

## 2019-09-15 LAB — NOVEL CORONAVIRUS, NAA: SARS-CoV-2, NAA: NOT DETECTED

## 2019-10-27 DIAGNOSIS — L814 Other melanin hyperpigmentation: Secondary | ICD-10-CM | POA: Diagnosis not present

## 2019-10-27 DIAGNOSIS — Z85828 Personal history of other malignant neoplasm of skin: Secondary | ICD-10-CM | POA: Diagnosis not present

## 2019-10-27 DIAGNOSIS — L821 Other seborrheic keratosis: Secondary | ICD-10-CM | POA: Diagnosis not present

## 2019-10-27 DIAGNOSIS — D485 Neoplasm of uncertain behavior of skin: Secondary | ICD-10-CM | POA: Diagnosis not present

## 2019-10-27 DIAGNOSIS — D225 Melanocytic nevi of trunk: Secondary | ICD-10-CM | POA: Diagnosis not present

## 2019-10-27 DIAGNOSIS — D2262 Melanocytic nevi of left upper limb, including shoulder: Secondary | ICD-10-CM | POA: Diagnosis not present

## 2019-10-27 DIAGNOSIS — D1801 Hemangioma of skin and subcutaneous tissue: Secondary | ICD-10-CM | POA: Diagnosis not present

## 2019-10-27 DIAGNOSIS — D2362 Other benign neoplasm of skin of left upper limb, including shoulder: Secondary | ICD-10-CM | POA: Diagnosis not present

## 2019-11-10 DIAGNOSIS — D485 Neoplasm of uncertain behavior of skin: Secondary | ICD-10-CM | POA: Diagnosis not present

## 2019-11-10 DIAGNOSIS — Z85828 Personal history of other malignant neoplasm of skin: Secondary | ICD-10-CM | POA: Diagnosis not present

## 2020-01-24 DIAGNOSIS — E038 Other specified hypothyroidism: Secondary | ICD-10-CM | POA: Diagnosis not present

## 2020-01-24 DIAGNOSIS — E559 Vitamin D deficiency, unspecified: Secondary | ICD-10-CM | POA: Diagnosis not present

## 2020-01-24 DIAGNOSIS — R5383 Other fatigue: Secondary | ICD-10-CM | POA: Diagnosis not present

## 2020-01-24 DIAGNOSIS — Z79899 Other long term (current) drug therapy: Secondary | ICD-10-CM | POA: Diagnosis not present

## 2020-01-29 ENCOUNTER — Encounter: Payer: Self-pay | Admitting: Neurology

## 2020-01-31 ENCOUNTER — Encounter: Payer: Self-pay | Admitting: Neurology

## 2020-01-31 ENCOUNTER — Ambulatory Visit (INDEPENDENT_AMBULATORY_CARE_PROVIDER_SITE_OTHER): Payer: Medicare Other | Admitting: Neurology

## 2020-01-31 ENCOUNTER — Other Ambulatory Visit: Payer: Self-pay

## 2020-01-31 VITALS — BP 120/80 | HR 83 | Temp 96.5°F | Ht 71.0 in | Wt 241.0 lb

## 2020-01-31 DIAGNOSIS — Z9989 Dependence on other enabling machines and devices: Secondary | ICD-10-CM | POA: Diagnosis not present

## 2020-01-31 DIAGNOSIS — G4733 Obstructive sleep apnea (adult) (pediatric): Secondary | ICD-10-CM | POA: Diagnosis not present

## 2020-01-31 NOTE — Patient Instructions (Addendum)
Please continue using your CPAP regularly. While your insurance requires that you use CPAP at least 4 hours each night on 70% of the nights, I recommend, that you not skip any nights and use it throughout the night if you can. Getting used to CPAP and staying with the treatment long term does take time and patience and discipline. Untreated obstructive sleep apnea when it is moderate to severe can have an adverse impact on cardiovascular health and raise her risk for heart disease, arrhythmias, hypertension, congestive heart failure, stroke and diabetes. Untreated obstructive sleep apnea causes sleep disruption, nonrestorative sleep, and sleep deprivation. This can have an impact on your day to day functioning and cause daytime sleepiness and impairment of cognitive function, memory loss, mood disturbance, and problems focussing. Using CPAP regularly can improve these symptoms.  We can see you in 1 year.   I will order new supplies and also request a mask fit appointment through [employer].  We will have you try nasal pillows.

## 2020-01-31 NOTE — Progress Notes (Signed)
Subjective:    Patient ID: [name] is a 69 y.o. male.  HPI     Interim history:   [name] is a 69 year old right-handed gentleman with an underlying medical history of ADD, reflux disease, hypothyroidism, hyperlipidemia, depression, anxiety, hypertension, diverticulosis, allergic rhinitis, recent diagnosis of bladder cancer, and obesity, who presents for follow-up consultation of his obstructive sleep apnea, well established on CPAP therapy. The patient is unaccompanied today and presents for his yearly checkup. I last saw him on 01/25/2019, at which time he was compliant with his CPAP.  He was trying to lose weight, he wanted to try nasal pillows as he was using a full facemask.    Today, 01/31/2020: I reviewed his CPAP compliance data from 12/31/2019 through 01/29/2020 which is a total of 30 days, during which time he used his machine every night with percent use days greater than 4 hours at 97%, indicating excellent compliance with an average usage of 7 hours and 31 minutes, residual AHI at goal at 1.9/h, leak on the high side with a 95th percentile at 32.8 L/min on a pressure of 13 cm with EPR of 3.  He reports that he never got around to trying nasal pillows because of the pandemic.  He was scheduled for his first vaccine dose but it was canceled because of shortage.  He has not heard back yet.  He has done well thankfully.  He has not had any changes in his medical history or medications.  He would like to try the nasal pillows and is willing to make an appointment for mask refit.  He continues to benefit from CPAP therapy and and is motivated to continue with treatment.     The patient's allergies, current medications, family history, past medical history, past social history, past surgical history and problem list were reviewed and updated as appropriate.    Previously (copied from previous notes for reference):   I saw him on 01/25/2018, at which time he was compliant with his CPAP.  He had interim issues with bronchitis and diagnosis of pneumonia. Is also diagnosed with bladder cancer and had undergone surgery for this in June and July 2018. He had immunotherapy with BCG after that. Overall, he had done quite well, thankfully.      I reviewed his CPAP compliance data from 12/25/2018 through 01/23/2019 which is a total of 30 days, during which time he used his machine every night with percent used days greater than 4 hours at 97%, indicating excellent compliance with an average usage of 6 hours and 37 minutes, residual AHI 4.7 per hour, leak on the high side, more so on the past 2 weeks with the 95th percentile at 38.6 L/m on a pressure of 13 cm with EPR of 3.    I saw him on 01/22/2017, at which time he was compliant with CPAP. He had trouble with maintaining a good feel on the mask. He also received recent replacement CPAP machine due to a defect in his old machine.   I reviewed his CPAP compliance data from 12/22/2017 through 09/19/2018 which is a total of 30 days, during which time he used his machine 29 days with percent used days greater than 4 hours at 83%, indicating very good compliance with an average usage of 6 hours and 25 minutes, residual AHI 3.5 per hour, leak high with the 95th percentile at 50.8 L/m on a pressure of 13 cm with EPR of 3.    I was able  to review his CPAP compliance data from 12/23/2016 through 01/21/2017 which is a total of 30 days, during which time he used his machine 29 days with percent used days greater than 4 hours at 93%, indicating excellent compliance with an average usage of 6 hours and 57 minutes, residual AHI 1.9 per hour, pressure at 13 cm with EPR of 3, leak high with the 95th percentile at 48.9 L/m.   I saw him on 01/24/2016, at which time he reported doing well. He was compliant with CPAP therapy. He was staying busy farming blueberries. His weight had increased a little bit.   I last saw him on 07/03/2015, at which time we reviewed  his test results. He reported feeling better with respect to his sleep, feeling better rested, having more daytime energy, and better sleep consolidation. He had some mouth dryness. He was using a mouthwash for that. He was compliant with treatment and commended for this as well as advised to continue regularly with CPAP therapy.    I reviewed his CPAP compliance data from 12/24/2015 through 01/22/2016 which is a total of 30 days during which time he used his machine 27 days with percent used days greater than 4 hours at 90%, indicating excellent compliance with an average usage of 7 hours and 7 minutes, residual AHI low at 1.5 per hour, leak for the most part acceptable with the 95th percentile at 24.2 L/m on a pressure of 13 cm with EPR of 3.   I first met him on 04/16/2015 at the request of his primary care physician, at which time the patient reported snoring and excessive daytime somnolence. I invited him back for sleep study. He had a split-night sleep study on 04/26/2015 underwent over his test results with him in detail today. His baseline sleep efficiency was only 28.4% with a latency to sleep of 218 minutes and wake after sleep onset of 40.5 minutes. He had a markedly elevated arousal index secondary to respiratory events. He had severe PLMS at 93.7 per hour with an associated arousal index of 5.3 per hour. Mild to moderate snoring was noted. Total AHI was 35.7 per hour. Baseline oxygen saturation was 92%, nadir was 86%. He was then titrated on CPAP. Sleep efficiency was improved at 88.6%. Arousal index was improved. REM sleep was increased at 30.5%, indicating REM rebound. Average oxygen saturation was 95%, nadir was 85%. He had moderate PLMS with minimal arousals. CPAP was titrated from 5-13 cm. AHI was 0 per hour at the final pressure. Based on the test results are prescribed CPAP therapy for home use.    I reviewed his CPAP compliance data from 06/02/2015 through 07/01/2015 which is a total of 30  days during which time he used his machine every night with the exception of one night, with percent used days greater than 4 hours at 97% indicating excellent compliance with an average usage of 7 hours and 52 minutes, residual AHI at 3.1 per hour, leak low with the 95th percentile at 10.5 L/m on a pressure of 13 cm with EPR of 3.   He reports snoring and excessive daytime somnolence. I reviewed your office note from 04/13/2015 which you kindly included. He is currently on Concerta 54 mg daily. In addition, he is on atorvastatin, baby aspirin, Synthroid, omega-3, lisinopril-hydrochlorothiazide and Lexapro. I reviewed laboratory test results from 02/09/2015 which you kindly included. Vitamin D was mildly low at 22.1, vitamin B12 was 386, CMP was unremarkable, total cholesterol 181, LDL 111, HDL  43, TSH 2.16, urinalysis negative.    He reports lack of energy, daytime somnolence, not feeling rested when he first wakes up. He tries to keep of sleep and wake schedule, he denies morning headaches, he has been told that not only he snores but he has witnessed pauses in his breathing according to his wife. He drinks a lot of caffeine in the form of coffee which is brewed half-and-half but he can have up to 6 cups in the morning. He usually stops drinking coffee by 11 AM. He does not drink it all day long. He drinks alcohol approximately 1 drink per day. He smoked cigars years ago. He was never a cigarette smoker. He had a tonsillectomy and adenoidectomy at age 61. He has a strong suspicion that his father had severe obstructive sleep apnea. 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 He does not drink sodas regularly. He is a restless sleeper but denies restless leg symptoms and is not aware of any leg twitching at night. He's had weight gain for the past year. Symptoms with his sleep have been more noticeable in the  past year. His Epworth sleepiness score is 15 and fatigue scores 47 today.   He had fractured his nose in the distant past. He had rhinoplasty in the 54s. He then had 2 more no surgeries. Recently he tried Afrin for nasal decongestion but is aware that he is not supposed to use it on a regular basis. Flonase did not help.   He has nocturia once or twice per night on average.  His Past Medical History Is Significant For: Past Medical History:  Diagnosis Date  . Benign localized prostatic hyperplasia with lower urinary tract symptoms (LUTS)   . Bladder cancer ([place]) urolgoist-  dr Jeffie Pollock  . History of adenomatous polyp of colon    2002  tubular adenoma  . HOH (hard of hearing)   . Hyperlipidemia   . Hypothyroidism   . OAB (overactive bladder)   . OSA on CPAP    severe osa per study 05/ 2016  . Wears glasses     His Past Surgical History Is Significant For: Past Surgical History:  Procedure Laterality Date  . APPENDECTOMY    . CYSTOSCOPY WITH RETROGRADE PYELOGRAM, URETEROSCOPY AND STENT PLACEMENT Right 07/07/2017   Procedure: CYSTOSCOPY;  Surgeon: Irine Seal, MD;  Location: [facility];  Service: Urology;  Laterality: Right;  . CYSTOSCOPY WITH STENT PLACEMENT Right 05/28/2017   Procedure: CYSTOSCOPY;  Surgeon: Irine Seal, MD;  Location: [facility];  Service: Urology;  Laterality: Right;  . KNEE ARTHROSCOPY Right    w mcl repair  . LUMBAR MICRODISCECTOMY  05-28-2000;  12-09-2002   L4--5 (05-28-2000)/  L5--S1 (12-09-2002)  . RHINOPLASTY    . TRANSURETHRAL RESECTION OF BLADDER TUMOR N/A 05/28/2017   Procedure: TRANSURETHRAL RESECTION OF BLADDER TUMOR (TURBT);  Surgeon: Irine Seal, MD;  Location: [facility];  Service: Urology;  Laterality: N/A;  . TRANSURETHRAL RESECTION OF BLADDER TUMOR N/A 07/07/2017   Procedure: RESTAGING TRANSURETHRAL RESECTION OF BLADDER TUMOR (TURBT);  Surgeon: Irine Seal, MD;  Location: [facility];   Service: Urology;  Laterality: N/A;    His Family History Is Significant For: Family History  Problem Relation Age of Onset  . Colon cancer Mother   . Heart murmur Mother   . Colon cancer Father   .  Diabetes Father     His Social History Is Significant For: Social History   Socioeconomic History  . Marital status: Married    Spouse name: Not on file  . Number of children: 1  . Years of education: BS   . Highest education level: Not on file  Occupational History  . Occupation: Retired  Tobacco Use  . Smoking status: Former Smoker    Quit date: 05/20/1997    Years since quitting: 22.7  . Smokeless tobacco: Never Used  . Tobacco comment: Quit in 1980' s  Substance and Sexual Activity  . Alcohol use: Yes    Alcohol/week: 3.0 standard drinks    Types: 3 Cans of beer per week  . Drug use: No  . Sexual activity: Not on file  Other Topics Concern  . Not on file  Social History Narrative   Daily consumes coffee, tea, and some soda    Social Determinants of Health   Financial Resource Strain:   . Difficulty of Paying Living Expenses: Not on file  Food Insecurity:   . Worried About Charity fundraiser in the Last Year: Not on file  . Ran Out of Food in the Last Year: Not on file  Transportation Needs:   . Lack of Transportation (Medical): Not on file  . Lack of Transportation (Non-Medical): Not on file  Physical Activity:   . Days of Exercise per Week: Not on file  . Minutes of Exercise per Session: Not on file  Stress:   . Feeling of Stress : Not on file  Social Connections:   . Frequency of Communication with Friends and Family: Not on file  . Frequency of Social Gatherings with Friends and Family: Not on file  . Attends Religious Services: Not on file  . Active Member of Clubs or Organizations: Not on file  . Attends Archivist Meetings: Not on file  . Marital Status: Not on file    His Allergies Are:  No Known Allergies:   His Current Medications  Are:  Outpatient Encounter Medications as of 01/31/2020  Medication Sig  . aspirin 81 MG tablet Take 81 mg by mouth daily.  . ATORVASTATIN CALCIUM PO Take 40 mg by mouth every morning.   . cholecalciferol (VITAMIN D) 1000 UNITS tablet Take 2,000 Units by mouth daily.  Marland Kitchen escitalopram (LEXAPRO) 20 MG tablet Take 10 mg by mouth every morning. Pt taking 1/2 tab daily  . Levothyroxine Sodium (SYNTHROID PO) Take 137 mcg by mouth every morning.   Marland Kitchen lisinopril-hydrochlorothiazide (PRINZIDE,ZESTORETIC) 20-12.5 MG per tablet Take 1 tablet by mouth every morning.   . methylphenidate (CONCERTA) 54 MG CR tablet Take 54 mg by mouth every morning.   No facility-administered encounter medications on file as of 01/31/2020.  :  Review of Systems:  Out of a complete 14 point review of systems, all are reviewed and negative with the exception of these symptoms as listed below:  Review of Systems  Neurological:       Yearly f/u reports he has been doing well.    Objective:  Neurological Exam  Physical Exam Physical Examination:   Vitals:   01/31/20 1152  BP: 120/80  Pulse: 83  Temp: (!) 96.5 F (35.8 C)    General Examination: The patient is a very pleasant 69 y.o. male in no acute distress. He appears well-developed and well-nourished and well groomed.   HEENT:Normocephalic, atraumatic, pupils are equal, round and reactive to light, extraocular tracking is good without  limitation to gaze excursion or nystagmus noted. Hearing is grossly intact. Face is symmetric with normal facial animation and normal facial sensation. Speech is clear with no dysarthria noted. There is no hypophonia. There is no lip, neck/head, jaw or voice tremor. Neck shows FROM. Oropharynx exam reveals: mildmouth dryness, gooddental hygiene and mildairway crowding. Tongue protrudes centrally and palate elevates symmetrically.   Chest:Clear to auscultation without wheezing, rhonchi or crackles noted.  Heart:S1+S2+0,  regular and normal without murmurs, rubs or gallops noted.   Abdomen:Soft, non-tender and non-distended.   Extremities:There isno obvious edema in the distal lower extremities bilaterally.  Skin: Warm and dry without trophic changes noted.  Musculoskeletal: exam reveals no obvious joint deformities, tenderness or joint swelling or erythema.  Neurologically:  Mental status: The patient is awake, alert and oriented in all 4 spheres.Hisimmediate and remote memory, attention, language skills and fund of knowledge are appropriate. There is no evidence of aphasia, agnosia, apraxia or anomia. Speech is clear with normal prosody and enunciation. Thought process is linear. Mood is normaland affect is normal.  Cranial nerves II - XII are as described above under HEENT exam.  Motor exam: Normal bulk, strength and tone is noted. There is no tremor, fine motor skills and coordination:grosslyintact. Cerebellar testing: No dysmetria or intention tremor. There is no truncal or gait ataxia.  Sensory exam: intact to light touch in the upper and lower extremities.  Gait, station and balance:Hestands easily. No veering to one side is noted. No leaning to one side is noted. Posture is age-appropriate and stance is narrow based. Gait showsnormalstride length and normalpace. No problems turning are noted.   Assessmentand Plan:  In summary,Sami B Hayesis a very pleasant 66 year oldmalewithan underlying medical history of ADD, reflux disease, hyperlipidemia, hypothyroidism, hypertension, diverticulosis, allergic rhinitis, obesity, depression and anxiety, who presents for follow-up consultation of his OSAfor his yearly check up.He continues to be compliant with CPAP. Of note, he had a split-night sleep study in May 2016.He is commended for his ongoing treatment adherence and reports ongoing good results. He would like to try nasal pillows. I requested a mask refit and PAP supplies through  his DME company. I advised him to FU in one year checkup. He May be eligible for a new machine by the middle or end of this year.  We will talk about this at our next appointment.  I answered all his questions today and he was in agreement. I spent 25 minutes in total face-to-face time and in reviewing records during pre-charting, more than 50% of which was spent in counseling and coordination of care, reviewing test results, reviewing medications and treatment regimen and/or in discussing or reviewing the diagnosis of OSA, the prognosis and treatment options. Pertinent laboratory and imaging test results that were available during this visit with the patient were reviewed by me and considered in my medical decision making (see chart for details).

## 2020-02-05 ENCOUNTER — Ambulatory Visit: Payer: Medicare Other | Attending: Internal Medicine

## 2020-02-05 DIAGNOSIS — Z23 Encounter for immunization: Secondary | ICD-10-CM | POA: Insufficient documentation

## 2020-02-05 NOTE — Progress Notes (Signed)
   Covid-19 Vaccination Clinic  Name:  [name]    MRN: [id number] DOB: [birth year]  02/05/2020  [name] was observed post Covid-19 immunization for 15 minutes without incidence. He was provided with Vaccine Information Sheet and instruction to access the V-Safe system.   [name] was instructed to call 911 with any severe reactions post vaccine: Marland Kitchen Difficulty breathing  . Swelling of your face and throat  . A fast heartbeat  . A bad rash all over your body  . Dizziness and weakness    Immunizations Administered    Name Date Dose VIS Date Route   Pfizer COVID-19 Vaccine 02/05/2020  9:41 AM 0.3 mL 11/18/2019 Intramuscular   Manufacturer: [place]   Lot: [id number]   [place]: [id number]

## 2020-02-15 DIAGNOSIS — R972 Elevated prostate specific antigen [PSA]: Secondary | ICD-10-CM | POA: Diagnosis not present

## 2020-02-22 DIAGNOSIS — Z8551 Personal history of malignant neoplasm of bladder: Secondary | ICD-10-CM | POA: Diagnosis not present

## 2020-02-22 DIAGNOSIS — N401 Enlarged prostate with lower urinary tract symptoms: Secondary | ICD-10-CM | POA: Diagnosis not present

## 2020-02-22 DIAGNOSIS — R350 Frequency of micturition: Secondary | ICD-10-CM | POA: Diagnosis not present

## 2020-02-22 DIAGNOSIS — R972 Elevated prostate specific antigen [PSA]: Secondary | ICD-10-CM | POA: Diagnosis not present

## 2020-02-22 DIAGNOSIS — R351 Nocturia: Secondary | ICD-10-CM | POA: Diagnosis not present

## 2020-02-27 ENCOUNTER — Ambulatory Visit: Payer: Medicare Other | Attending: Internal Medicine

## 2020-02-27 DIAGNOSIS — Z23 Encounter for immunization: Secondary | ICD-10-CM

## 2020-02-27 NOTE — Progress Notes (Signed)
   Covid-19 Vaccination Clinic  Name:  [name]    MRN: [id number] DOB: 07-31-1951  02/27/2020  Mr. Dacquisto was observed post Covid-19 immunization for 15 minutes without incident. He was provided with Vaccine Information Sheet and instruction to access the V-Safe system.   Mr. Quesnel was instructed to call 911 with any severe reactions post vaccine: Marland Kitchen Difficulty breathing  . Swelling of face and throat  . A fast heartbeat  . A bad rash all over body  . Dizziness and weakness   Immunizations Administered    Name Date Dose VIS Date Route   Pfizer COVID-19 Vaccine 02/27/2020  9:56 AM 0.3 mL 11/18/2019 Intramuscular   Manufacturer: [place]   Lot: [id number]   [place]: [id number]

## 2020-03-06 DIAGNOSIS — E7849 Other hyperlipidemia: Secondary | ICD-10-CM | POA: Diagnosis not present

## 2020-03-06 DIAGNOSIS — Z125 Encounter for screening for malignant neoplasm of prostate: Secondary | ICD-10-CM | POA: Diagnosis not present

## 2020-03-06 DIAGNOSIS — R739 Hyperglycemia, unspecified: Secondary | ICD-10-CM | POA: Diagnosis not present

## 2020-03-13 DIAGNOSIS — R739 Hyperglycemia, unspecified: Secondary | ICD-10-CM | POA: Diagnosis not present

## 2020-03-13 DIAGNOSIS — F419 Anxiety disorder, unspecified: Secondary | ICD-10-CM | POA: Diagnosis not present

## 2020-03-13 DIAGNOSIS — I2510 Atherosclerotic heart disease of native coronary artery without angina pectoris: Secondary | ICD-10-CM | POA: Diagnosis not present

## 2020-03-13 DIAGNOSIS — D414 Neoplasm of uncertain behavior of bladder: Secondary | ICD-10-CM | POA: Diagnosis not present

## 2020-03-13 DIAGNOSIS — Z0000 Encounter for general adult medical examination without abnormal findings: Secondary | ICD-10-CM | POA: Diagnosis not present

## 2020-03-13 DIAGNOSIS — G4733 Obstructive sleep apnea (adult) (pediatric): Secondary | ICD-10-CM | POA: Diagnosis not present

## 2020-03-13 DIAGNOSIS — R972 Elevated prostate specific antigen [PSA]: Secondary | ICD-10-CM | POA: Diagnosis not present

## 2020-03-13 DIAGNOSIS — R82998 Other abnormal findings in urine: Secondary | ICD-10-CM | POA: Diagnosis not present

## 2020-03-13 DIAGNOSIS — E669 Obesity, unspecified: Secondary | ICD-10-CM | POA: Diagnosis not present

## 2020-03-13 DIAGNOSIS — I10 Essential (primary) hypertension: Secondary | ICD-10-CM | POA: Diagnosis not present

## 2020-03-13 DIAGNOSIS — Z1331 Encounter for screening for depression: Secondary | ICD-10-CM | POA: Diagnosis not present

## 2020-03-13 DIAGNOSIS — M25561 Pain in right knee: Secondary | ICD-10-CM | POA: Diagnosis not present

## 2020-03-13 DIAGNOSIS — C4492 Squamous cell carcinoma of skin, unspecified: Secondary | ICD-10-CM | POA: Diagnosis not present

## 2020-03-13 DIAGNOSIS — E785 Hyperlipidemia, unspecified: Secondary | ICD-10-CM | POA: Diagnosis not present

## 2020-07-05 DIAGNOSIS — H25813 Combined forms of age-related cataract, bilateral: Secondary | ICD-10-CM | POA: Diagnosis not present

## 2020-07-05 DIAGNOSIS — H5203 Hypermetropia, bilateral: Secondary | ICD-10-CM | POA: Diagnosis not present

## 2020-07-05 DIAGNOSIS — H0100A Unspecified blepharitis right eye, upper and lower eyelids: Secondary | ICD-10-CM | POA: Diagnosis not present

## 2020-07-05 DIAGNOSIS — H43813 Vitreous degeneration, bilateral: Secondary | ICD-10-CM | POA: Diagnosis not present

## 2020-09-18 DIAGNOSIS — I2510 Atherosclerotic heart disease of native coronary artery without angina pectoris: Secondary | ICD-10-CM | POA: Diagnosis not present

## 2020-09-18 DIAGNOSIS — F900 Attention-deficit hyperactivity disorder, predominantly inattentive type: Secondary | ICD-10-CM | POA: Diagnosis not present

## 2020-09-18 DIAGNOSIS — E559 Vitamin D deficiency, unspecified: Secondary | ICD-10-CM | POA: Diagnosis not present

## 2020-09-18 DIAGNOSIS — R972 Elevated prostate specific antigen [PSA]: Secondary | ICD-10-CM | POA: Diagnosis not present

## 2020-09-18 DIAGNOSIS — G4733 Obstructive sleep apnea (adult) (pediatric): Secondary | ICD-10-CM | POA: Diagnosis not present

## 2020-09-18 DIAGNOSIS — R202 Paresthesia of skin: Secondary | ICD-10-CM | POA: Diagnosis not present

## 2020-09-18 DIAGNOSIS — E039 Hypothyroidism, unspecified: Secondary | ICD-10-CM | POA: Diagnosis not present

## 2020-09-18 DIAGNOSIS — Z23 Encounter for immunization: Secondary | ICD-10-CM | POA: Diagnosis not present

## 2020-09-18 DIAGNOSIS — E785 Hyperlipidemia, unspecified: Secondary | ICD-10-CM | POA: Diagnosis not present

## 2020-09-18 DIAGNOSIS — I10 Essential (primary) hypertension: Secondary | ICD-10-CM | POA: Diagnosis not present

## 2020-09-18 DIAGNOSIS — R739 Hyperglycemia, unspecified: Secondary | ICD-10-CM | POA: Diagnosis not present

## 2020-09-18 DIAGNOSIS — R5383 Other fatigue: Secondary | ICD-10-CM | POA: Diagnosis not present

## 2020-09-18 DIAGNOSIS — C4492 Squamous cell carcinoma of skin, unspecified: Secondary | ICD-10-CM | POA: Diagnosis not present

## 2020-09-25 DIAGNOSIS — N401 Enlarged prostate with lower urinary tract symptoms: Secondary | ICD-10-CM | POA: Diagnosis not present

## 2020-09-25 DIAGNOSIS — R351 Nocturia: Secondary | ICD-10-CM | POA: Diagnosis not present

## 2020-09-25 DIAGNOSIS — R972 Elevated prostate specific antigen [PSA]: Secondary | ICD-10-CM | POA: Diagnosis not present

## 2020-09-25 DIAGNOSIS — Z8551 Personal history of malignant neoplasm of bladder: Secondary | ICD-10-CM | POA: Diagnosis not present

## 2021-01-10 DIAGNOSIS — L920 Granuloma annulare: Secondary | ICD-10-CM | POA: Diagnosis not present

## 2021-01-10 DIAGNOSIS — L821 Other seborrheic keratosis: Secondary | ICD-10-CM | POA: Diagnosis not present

## 2021-01-10 DIAGNOSIS — D2262 Melanocytic nevi of left upper limb, including shoulder: Secondary | ICD-10-CM | POA: Diagnosis not present

## 2021-01-10 DIAGNOSIS — D1801 Hemangioma of skin and subcutaneous tissue: Secondary | ICD-10-CM | POA: Diagnosis not present

## 2021-01-10 DIAGNOSIS — L570 Actinic keratosis: Secondary | ICD-10-CM | POA: Diagnosis not present

## 2021-01-10 DIAGNOSIS — D225 Melanocytic nevi of trunk: Secondary | ICD-10-CM | POA: Diagnosis not present

## 2021-01-10 DIAGNOSIS — Z85828 Personal history of other malignant neoplasm of skin: Secondary | ICD-10-CM | POA: Diagnosis not present

## 2021-01-10 DIAGNOSIS — L814 Other melanin hyperpigmentation: Secondary | ICD-10-CM | POA: Diagnosis not present

## 2021-01-10 DIAGNOSIS — B078 Other viral warts: Secondary | ICD-10-CM | POA: Diagnosis not present

## 2021-01-31 ENCOUNTER — Ambulatory Visit (INDEPENDENT_AMBULATORY_CARE_PROVIDER_SITE_OTHER): Payer: Medicare Other | Admitting: Neurology

## 2021-01-31 ENCOUNTER — Other Ambulatory Visit: Payer: Self-pay

## 2021-01-31 ENCOUNTER — Encounter: Payer: Self-pay | Admitting: Neurology

## 2021-01-31 VITALS — BP 133/79 | HR 75 | Ht 71.0 in | Wt 243.3 lb

## 2021-01-31 DIAGNOSIS — Z9989 Dependence on other enabling machines and devices: Secondary | ICD-10-CM | POA: Diagnosis not present

## 2021-01-31 DIAGNOSIS — G4733 Obstructive sleep apnea (adult) (pediatric): Secondary | ICD-10-CM

## 2021-01-31 NOTE — Patient Instructions (Signed)
Medical instructions given: Continue using CPAP regularly, follow-up routinely in 1 year.  Prescription for CPAP supplies renewed, we will revisit eligibility for new machine next year.

## 2021-01-31 NOTE — Progress Notes (Signed)
Subjective:    Patient ID: [name] is a 70 y.o. male.  HPI     Interim history:   [name] is a 70 year old right-handed gentleman with an underlying medical history of ADD, reflux disease, hypothyroidism, hyperlipidemia, depression, anxiety, hypertension, diverticulosis, allergic rhinitis, bladder cancer, and obesity, who presents for follow-up consultation of his obstructive sleep apnea, well established on CPAP therapy. The patient is unaccompanied today and presents for his yearly checkup. I last saw him on 01/29/2020, at which time he was compliant with his CPAP and doing well.  He wanted to try the nasal pillows and was encouraged to make a mask fit appointment with his DME company.  He was advised to follow-up routinely in 1 year.  Today, 01/31/2021: I reviewed his CPAP compliance data from 12/31/2020 through 01/29/2021, which is a total of 30 days, during which time he used his machine every night with percent use days greater than 4 hours at 100%, indicating superb compliance with an average usage of 7 hours and 14 minutes, residual AHI at goal at 2.4/h, leak acceptable on the higher side with a 95th percentile at 17.3 L/min on a pressure of 13 cm with EPR of 3.  He reports doing well.  He is compliant with his CPAP, sometimes the humidifier does not use up all the water and he sees water leftover in the water chamber which is unusual.  He has not changed his settings.  Other than that, he has no problems with the usage of the machine or the machine itself.  He has shaved his beard and noticed an immediate improvement in the air leakage from the mask.  He has had no changes to his medications or medical history.   The patient's allergies, current medications, family history, past medical history, past social history, past surgical history and problem list were reviewed and updated as appropriate.    Previously (copied from previous notes for reference):    I saw him on 01/25/2019, at  which time he was compliant with his CPAP.  He was trying to lose weight, he wanted to try nasal pillows as he was using a full facemask.     I reviewed his CPAP compliance data from 12/31/2019 through 01/29/2020 which is a total of 30 days, during which time he used his machine every night with percent use days greater than 4 hours at 97%, indicating excellent compliance with an average usage of 7 hours and 31 minutes, residual AHI at goal at 1.9/h, leak on the high side with a 95th percentile at 32.8 L/min on a pressure of 13 cm with EPR of 3.     I saw him on 01/25/2018, at which time he was compliant with his CPAP. He had interim issues with bronchitis and diagnosis of pneumonia. Is also diagnosed with bladder cancer and had undergone surgery for this in June and July 2018. He had immunotherapy with BCG after that. Overall, he had done quite well, thankfully.      I reviewed his CPAP compliance data from 12/25/2018 through 01/23/2019 which is a total of 30 days, during which time he used his machine every night with percent used days greater than 4 hours at 97%, indicating excellent compliance with an average usage of 6 hours and 37 minutes, residual AHI 4.7 per hour, leak on the high side, more so on the past 2 weeks with the 95th percentile at 38.6 L/m on a pressure of 13 cm with EPR of 3.  I saw him on 01/22/2017, at which time he was compliant with CPAP. He had trouble with maintaining a good feel on the mask. He also received recent replacement CPAP machine due to a defect in his old machine.   I reviewed his CPAP compliance data from 12/22/2017 through 09/19/2018 which is a total of 30 days, during which time he used his machine 29 days with percent used days greater than 4 hours at 83%, indicating very good compliance with an average usage of 6 hours and 25 minutes, residual AHI 3.5 per hour, leak high with the 95th percentile at 50.8 L/m on a pressure of 13 cm with EPR of 3.    I was able to  review his CPAP compliance data from 12/23/2016 through 01/21/2017 which is a total of 30 days, during which time he used his machine 29 days with percent used days greater than 4 hours at 93%, indicating excellent compliance with an average usage of 6 hours and 57 minutes, residual AHI 1.9 per hour, pressure at 13 cm with EPR of 3, leak high with the 95th percentile at 48.9 L/m.   I saw him on 01/24/2016, at which time he reported doing well. He was compliant with CPAP therapy. He was staying busy farming blueberries. His weight had increased a little bit.   I last saw him on 07/03/2015, at which time we reviewed his test results. He reported feeling better with respect to his sleep, feeling better rested, having more daytime energy, and better sleep consolidation. He had some mouth dryness. He was using a mouthwash for that. He was compliant with treatment and commended for this as well as advised to continue regularly with CPAP therapy.    I reviewed his CPAP compliance data from 12/24/2015 through 01/22/2016 which is a total of 30 days during which time he used his machine 27 days with percent used days greater than 4 hours at 90%, indicating excellent compliance with an average usage of 7 hours and 7 minutes, residual AHI low at 1.5 per hour, leak for the most part acceptable with the 95th percentile at 24.2 L/m on a pressure of 13 cm with EPR of 3.   I first met him on 04/16/2015 at the request of his primary care physician, at which time the patient reported snoring and excessive daytime somnolence. I invited him back for sleep study. He had a split-night sleep study on 04/26/2015 underwent over his test results with him in detail today. His baseline sleep efficiency was only 28.4% with a latency to sleep of 218 minutes and wake after sleep onset of 40.5 minutes. He had a markedly elevated arousal index secondary to respiratory events. He had severe PLMS at 93.7 per hour with an associated arousal  index of 5.3 per hour. Mild to moderate snoring was noted. Total AHI was 35.7 per hour. Baseline oxygen saturation was 92%, nadir was 86%. He was then titrated on CPAP. Sleep efficiency was improved at 88.6%. Arousal index was improved. REM sleep was increased at 30.5%, indicating REM rebound. Average oxygen saturation was 95%, nadir was 85%. He had moderate PLMS with minimal arousals. CPAP was titrated from 5-13 cm. AHI was 0 per hour at the final pressure. Based on the test results are prescribed CPAP therapy for home use.    I reviewed his CPAP compliance data from 06/02/2015 through 07/01/2015 which is a total of 30 days during which time he used his machine every night with the exception of one  night, with percent used days greater than 4 hours at 97% indicating excellent compliance with an average usage of 7 hours and 52 minutes, residual AHI at 3.1 per hour, leak low with the 95th percentile at 10.5 L/m on a pressure of 13 cm with EPR of 3.   He reports snoring and excessive daytime somnolence. I reviewed your office note from 04/13/2015 which you kindly included. He is currently on Concerta 54 mg daily. In addition, he is on atorvastatin, baby aspirin, Synthroid, omega-3, lisinopril-hydrochlorothiazide and Lexapro. I reviewed laboratory test results from 02/09/2015 which you kindly included. Vitamin D was mildly low at 22.1, vitamin B12 was 386, CMP was unremarkable, total cholesterol 181, LDL 111, HDL 43, TSH 2.16, urinalysis negative.    He reports lack of energy, daytime somnolence, not feeling rested when he first wakes up. He tries to keep of sleep and wake schedule, he denies morning headaches, he has been told that not only he snores but he has witnessed pauses in his breathing according to his wife. He drinks a lot of caffeine in the form of coffee which is brewed half-and-half but he can have up to 6 cups in the morning. He usually stops drinking coffee by 11 AM. He does not drink it all day  long. He drinks alcohol approximately 1 drink per day. He smoked cigars years ago. He was never a cigarette smoker. He had a tonsillectomy and adenoidectomy at age 80. He has a strong suspicion that his father had severe obstructive sleep apnea. 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 He does not drink sodas regularly. He is a restless sleeper but denies restless leg symptoms and is not aware of any leg twitching at night. He's had weight gain for the past year. Symptoms with his sleep have been more noticeable in the past year. His Epworth sleepiness score is 15 and fatigue scores 47 today.   He had fractured his nose in the distant past. He had rhinoplasty in the 69s. He then had 2 more no surgeries. Recently he tried Afrin for nasal decongestion but is aware that he is not supposed to use it on a regular basis. Flonase did not help.   He has nocturia once or twice per night on average.  His Past Medical History Is Significant For: Past Medical History:  Diagnosis Date  . Benign localized prostatic hyperplasia with lower urinary tract symptoms (LUTS)   . Bladder cancer ([place]) urolgoist-  dr Jeffie Pollock  . History of adenomatous polyp of colon    2002  tubular adenoma  . HOH (hard of hearing)   . Hyperlipidemia   . Hypothyroidism   . OAB (overactive bladder)   . OSA on CPAP    severe osa per study 05/ 2016  . Wears glasses     His Past Surgical History Is Significant For: Past Surgical History:  Procedure Laterality Date  . APPENDECTOMY    . CYSTOSCOPY WITH RETROGRADE PYELOGRAM, URETEROSCOPY AND STENT PLACEMENT Right 07/07/2017   Procedure: CYSTOSCOPY;  Surgeon: Irine Seal, MD;  Location: [facility];  Service: Urology;  Laterality: Right;  . CYSTOSCOPY WITH STENT PLACEMENT Right 05/28/2017   Procedure: CYSTOSCOPY;  Surgeon: Irine Seal, MD;  Location: [facility];  Service: Urology;  Laterality: Right;  . KNEE ARTHROSCOPY Right    w mcl repair  . LUMBAR MICRODISCECTOMY  05-28-2000;  12-09-2002   L4--5 (05-28-2000)/  L5--S1 (12-09-2002)  . RHINOPLASTY    . TRANSURETHRAL RESECTION OF BLADDER TUMOR N/A 05/28/2017   Procedure: TRANSURETHRAL RESECTION OF BLADDER TUMOR (TURBT);  Surgeon: Irine Seal, MD;  Location: [facility];  Service: Urology;  Laterality: N/A;  . TRANSURETHRAL RESECTION OF BLADDER TUMOR N/A 07/07/2017   Procedure: RESTAGING TRANSURETHRAL RESECTION OF BLADDER TUMOR (TURBT);  Surgeon: Irine Seal, MD;  Location: [facility];  Service: Urology;  Laterality: N/A;    His Family History Is Significant For: Family History  Problem Relation Age of Onset  . Colon cancer Mother   . Heart murmur Mother   . Colon cancer Father   . Diabetes Father     His Social History Is Significant For: Social History   Socioeconomic History  . Marital status: Married    Spouse name: Not on file  . Number of children: 1  . Years of education: BS   . Highest education level: Not on file  Occupational History  . Occupation: Retired  Tobacco Use  . Smoking status: Former Smoker    Quit date: 05/20/1997    Years since quitting: 23.7  . Smokeless tobacco: Never Used  . Tobacco comment: Quit in 1980' s  Vaping Use  . Vaping Use: Never used  Substance and Sexual Activity  . Alcohol use: Yes    Alcohol/week: 3.0 standard drinks    Types: 3 Cans of beer per week  . Drug use: No  . Sexual activity: Not on file  Other Topics Concern  . Not on file  Social History Narrative   Daily consumes coffee, tea, and some soda    Social Determinants of Health   Financial Resource Strain: Not on file  Food Insecurity: Not on file  Transportation Needs: Not on file  Physical Activity: Not on file  Stress: Not on file  Social Connections: Not on file    His Allergies Are:  No Known Allergies:   His Current  Medications Are:  Outpatient Encounter Medications as of 01/31/2021  Medication Sig  . aspirin 81 MG tablet Take 81 mg by mouth daily.  . ATORVASTATIN CALCIUM PO Take 40 mg by mouth every morning.   . cholecalciferol (VITAMIN D) 1000 UNITS tablet Take 2,000 Units by mouth daily.  Marland Kitchen escitalopram (LEXAPRO) 20 MG tablet Take 10 mg by mouth every morning. Pt taking 1/2 tab daily  . Levothyroxine Sodium (SYNTHROID PO) Take 137 mcg by mouth every morning.   Marland Kitchen lisinopril-hydrochlorothiazide (PRINZIDE,ZESTORETIC) 20-12.5 MG per tablet Take 1 tablet by mouth every morning.   . methylphenidate (CONCERTA) 54 MG CR tablet Take 54 mg by mouth every morning.   No facility-administered encounter medications on file as of 01/31/2021.  :  Review of Systems:  Out of a complete 14 point review of systems, all are reviewed and negative with the exception of these symptoms as listed below: Review of Systems  Neurological:       Here for 1 year f/u. Reports he has been doing fairly well. Pt reports he still has water in the chamber when he wakes up in the morning.     Objective:  Neurological Exam  Physical Exam Physical Examination:   Vitals:   01/31/21 1033  BP: 133/79  Pulse: 75   General Examination: The patient is a very pleasant 70 y.o. male in no acute distress. He appears well-developed and well-nourished and well groomed.   HEENT:Normocephalic, atraumatic, pupils are equal,  round and reactive to light, extraocular tracking is well preserved.  Corrective eyeglasses in place. Hearing is grossly intact. Face is symmetric with normal facial animation. Speech is clear with no dysarthria noted. There is no hypophonia. There is no lip, neck/head, jaw or voice tremor. Neckshows FROM.Oropharynx exam reveals: mildmouth dryness, gooddental hygiene and mildairway crowding. Tongue protrudes centrally and palate elevates symmetrically.   Chest:Clear to auscultation without wheezing, rhonchi or  crackles noted.  Heart:S1+S2+0, regular and normal without murmurs, rubs or gallops noted.   Abdomen:Soft, non-tender and non-distended.  Extremities:There isno obvious edema in the distal lower extremities bilaterally.  Skin: Warm and dry without trophic changes noted.  Musculoskeletal: exam reveals no obvious joint deformities, tenderness or joint swelling or erythema.  Neurologically:  Mental status: The patient is awake, alert and oriented in all 4 spheres.Hisimmediate and remote memory, attention, language skills and fund of knowledge are appropriate. There is no evidence of aphasia, agnosia, apraxia or anomia. Speech is clear with normal prosody and enunciation. Thought process is linear. Mood is normaland affect is normal.  Cranial nerves II - XII are as described above under HEENT exam.  Motor exam: Normal bulk, strength and tone is noted. There is no tremor, fine motor skills and coordination:grosslyintact. Cerebellar testing: No dysmetria or intention tremor. There is no truncal or gait ataxia.  Sensory exam: intact to light touch.  Gait, station and balance:Hestands easily. No veering to one side is noted. No leaning to one side is noted. Posture is age-appropriate and stance is narrow based. Gait showsnormalstride length and normalpace. No problems turning are noted.  Assessmentand Plan:  In summary,Eleanor B Hayesis a very pleasant 89 year oldmalewithan underlying medical history of ADD, reflux disease, hyperlipidemia, hypothyroidism, hypertension, diverticulosis, allergic rhinitis, obesity, depression and anxiety, and bladder cancer, who presents for follow-up consultation of his OSAfor his yearly check up.He continues to be compliant with CPAP. Of note, he had a split-night sleep study in May 2016. He may be eligible for a new machine at this time but we mutually agreed to delay evaluation and pursuing a new machine until next year since he has  no trouble with the current machine.  Leak is better since he shaved his beard.  He is commended for hisongoing treatment adherence and reports ongoing good results.  I renewed his prescription for CPAP supplies.  He is advised to follow-up routinely in 1 year.  I answered all his questions today and he was in agreement.  I spent 20 minutes in total face-to-face time and in reviewing records during pre-charting, more than 50% of which was spent in counseling and treatment regimen and/or in discussing or reviewing the diagnosis of OSA, the prognosis and treatment options. Pertinent laboratory and imaging test results that were available during this visit with the patient were reviewed by me and considered in my medical decision making (see chart for details).

## 2021-03-11 DIAGNOSIS — R739 Hyperglycemia, unspecified: Secondary | ICD-10-CM | POA: Diagnosis not present

## 2021-03-11 DIAGNOSIS — Z125 Encounter for screening for malignant neoplasm of prostate: Secondary | ICD-10-CM | POA: Diagnosis not present

## 2021-03-11 DIAGNOSIS — E559 Vitamin D deficiency, unspecified: Secondary | ICD-10-CM | POA: Diagnosis not present

## 2021-03-11 DIAGNOSIS — E039 Hypothyroidism, unspecified: Secondary | ICD-10-CM | POA: Diagnosis not present

## 2021-03-11 DIAGNOSIS — E785 Hyperlipidemia, unspecified: Secondary | ICD-10-CM | POA: Diagnosis not present

## 2021-03-18 DIAGNOSIS — Z1339 Encounter for screening examination for other mental health and behavioral disorders: Secondary | ICD-10-CM | POA: Diagnosis not present

## 2021-03-18 DIAGNOSIS — M25561 Pain in right knee: Secondary | ICD-10-CM | POA: Diagnosis not present

## 2021-03-18 DIAGNOSIS — E559 Vitamin D deficiency, unspecified: Secondary | ICD-10-CM | POA: Diagnosis not present

## 2021-03-18 DIAGNOSIS — E039 Hypothyroidism, unspecified: Secondary | ICD-10-CM | POA: Diagnosis not present

## 2021-03-18 DIAGNOSIS — Z1331 Encounter for screening for depression: Secondary | ICD-10-CM | POA: Diagnosis not present

## 2021-03-18 DIAGNOSIS — Z0000 Encounter for general adult medical examination without abnormal findings: Secondary | ICD-10-CM | POA: Diagnosis not present

## 2021-03-18 DIAGNOSIS — E785 Hyperlipidemia, unspecified: Secondary | ICD-10-CM | POA: Diagnosis not present

## 2021-03-18 DIAGNOSIS — I10 Essential (primary) hypertension: Secondary | ICD-10-CM | POA: Diagnosis not present

## 2021-03-18 DIAGNOSIS — E669 Obesity, unspecified: Secondary | ICD-10-CM | POA: Diagnosis not present

## 2021-03-18 DIAGNOSIS — I2510 Atherosclerotic heart disease of native coronary artery without angina pectoris: Secondary | ICD-10-CM | POA: Diagnosis not present

## 2021-03-18 DIAGNOSIS — D414 Neoplasm of uncertain behavior of bladder: Secondary | ICD-10-CM | POA: Diagnosis not present

## 2021-03-18 DIAGNOSIS — R82998 Other abnormal findings in urine: Secondary | ICD-10-CM | POA: Diagnosis not present

## 2021-03-18 DIAGNOSIS — R5383 Other fatigue: Secondary | ICD-10-CM | POA: Diagnosis not present

## 2021-03-18 DIAGNOSIS — R972 Elevated prostate specific antigen [PSA]: Secondary | ICD-10-CM | POA: Diagnosis not present

## 2021-03-18 DIAGNOSIS — R739 Hyperglycemia, unspecified: Secondary | ICD-10-CM | POA: Diagnosis not present

## 2021-03-18 DIAGNOSIS — Z1212 Encounter for screening for malignant neoplasm of rectum: Secondary | ICD-10-CM | POA: Diagnosis not present

## 2021-03-22 DIAGNOSIS — M25561 Pain in right knee: Secondary | ICD-10-CM | POA: Diagnosis not present

## 2021-04-11 DIAGNOSIS — M1711 Unilateral primary osteoarthritis, right knee: Secondary | ICD-10-CM | POA: Diagnosis not present

## 2021-05-15 DIAGNOSIS — M25661 Stiffness of right knee, not elsewhere classified: Secondary | ICD-10-CM | POA: Diagnosis not present

## 2021-05-15 DIAGNOSIS — M25561 Pain in right knee: Secondary | ICD-10-CM | POA: Diagnosis not present

## 2021-05-15 DIAGNOSIS — M1711 Unilateral primary osteoarthritis, right knee: Secondary | ICD-10-CM | POA: Diagnosis not present

## 2021-05-30 DIAGNOSIS — Z5181 Encounter for therapeutic drug level monitoring: Secondary | ICD-10-CM | POA: Diagnosis not present

## 2021-05-30 DIAGNOSIS — M1711 Unilateral primary osteoarthritis, right knee: Secondary | ICD-10-CM | POA: Diagnosis not present

## 2021-07-08 DIAGNOSIS — H524 Presbyopia: Secondary | ICD-10-CM | POA: Diagnosis not present

## 2021-07-08 DIAGNOSIS — H25012 Cortical age-related cataract, left eye: Secondary | ICD-10-CM | POA: Diagnosis not present

## 2021-07-08 DIAGNOSIS — H2513 Age-related nuclear cataract, bilateral: Secondary | ICD-10-CM | POA: Diagnosis not present

## 2021-07-08 DIAGNOSIS — H52203 Unspecified astigmatism, bilateral: Secondary | ICD-10-CM | POA: Diagnosis not present

## 2021-07-15 DIAGNOSIS — Z0189 Encounter for other specified special examinations: Secondary | ICD-10-CM | POA: Diagnosis not present

## 2021-07-15 DIAGNOSIS — Z5181 Encounter for therapeutic drug level monitoring: Secondary | ICD-10-CM | POA: Diagnosis not present

## 2021-07-17 DIAGNOSIS — M1711 Unilateral primary osteoarthritis, right knee: Secondary | ICD-10-CM | POA: Diagnosis not present

## 2021-07-17 DIAGNOSIS — G8918 Other acute postprocedural pain: Secondary | ICD-10-CM | POA: Diagnosis not present

## 2021-07-17 HISTORY — PX: KNEE ARTHROSCOPY: SHX127

## 2021-07-17 HISTORY — PX: TOTAL HIP ARTHROPLASTY: SHX124

## 2021-07-19 DIAGNOSIS — M25561 Pain in right knee: Secondary | ICD-10-CM | POA: Diagnosis not present

## 2021-07-23 DIAGNOSIS — M25561 Pain in right knee: Secondary | ICD-10-CM | POA: Diagnosis not present

## 2021-07-26 DIAGNOSIS — M25561 Pain in right knee: Secondary | ICD-10-CM | POA: Diagnosis not present

## 2021-07-30 DIAGNOSIS — M25561 Pain in right knee: Secondary | ICD-10-CM | POA: Diagnosis not present

## 2021-08-02 DIAGNOSIS — M25561 Pain in right knee: Secondary | ICD-10-CM | POA: Diagnosis not present

## 2021-08-06 DIAGNOSIS — M25561 Pain in right knee: Secondary | ICD-10-CM | POA: Diagnosis not present

## 2021-08-09 DIAGNOSIS — M25561 Pain in right knee: Secondary | ICD-10-CM | POA: Diagnosis not present

## 2021-08-13 DIAGNOSIS — M25561 Pain in right knee: Secondary | ICD-10-CM | POA: Diagnosis not present

## 2021-08-16 DIAGNOSIS — M25561 Pain in right knee: Secondary | ICD-10-CM | POA: Diagnosis not present

## 2021-08-20 DIAGNOSIS — Z96651 Presence of right artificial knee joint: Secondary | ICD-10-CM | POA: Diagnosis not present

## 2021-08-20 DIAGNOSIS — Z471 Aftercare following joint replacement surgery: Secondary | ICD-10-CM | POA: Diagnosis not present

## 2021-08-26 DIAGNOSIS — M25561 Pain in right knee: Secondary | ICD-10-CM | POA: Diagnosis not present

## 2021-08-28 DIAGNOSIS — M25561 Pain in right knee: Secondary | ICD-10-CM | POA: Diagnosis not present

## 2021-09-03 DIAGNOSIS — M25561 Pain in right knee: Secondary | ICD-10-CM | POA: Diagnosis not present

## 2021-09-05 DIAGNOSIS — M25561 Pain in right knee: Secondary | ICD-10-CM | POA: Diagnosis not present

## 2021-09-09 ENCOUNTER — Encounter: Payer: Self-pay | Admitting: Neurology

## 2021-09-09 DIAGNOSIS — G4733 Obstructive sleep apnea (adult) (pediatric): Secondary | ICD-10-CM

## 2021-09-09 DIAGNOSIS — M25561 Pain in right knee: Secondary | ICD-10-CM | POA: Diagnosis not present

## 2021-09-09 NOTE — Telephone Encounter (Signed)
Secure  message sent to [facility] regarding new machine request.

## 2021-09-09 NOTE — Telephone Encounter (Signed)
Please prescribe new CPAP machine.  Patient should be able to get a new machine due to compliance with appointments and usage.  Please send to his DME company and set up follow-up appointment within 3 months after set up.

## 2021-09-09 NOTE — Addendum Note (Signed)
Addended by: Gildardo Griffes on: 09/09/2021 01:29 PM   Modules accepted: Orders

## 2021-09-11 DIAGNOSIS — M25561 Pain in right knee: Secondary | ICD-10-CM | POA: Diagnosis not present

## 2021-09-12 ENCOUNTER — Telehealth: Payer: Self-pay | Admitting: *Deleted

## 2021-09-12 NOTE — Telephone Encounter (Signed)
Emailed [facility] for new order for patient in [employer]

## 2021-09-16 DIAGNOSIS — M25561 Pain in right knee: Secondary | ICD-10-CM | POA: Diagnosis not present

## 2021-09-18 DIAGNOSIS — M25561 Pain in right knee: Secondary | ICD-10-CM | POA: Diagnosis not present

## 2021-09-23 DIAGNOSIS — E785 Hyperlipidemia, unspecified: Secondary | ICD-10-CM | POA: Diagnosis not present

## 2021-09-23 DIAGNOSIS — R972 Elevated prostate specific antigen [PSA]: Secondary | ICD-10-CM | POA: Diagnosis not present

## 2021-09-23 DIAGNOSIS — E559 Vitamin D deficiency, unspecified: Secondary | ICD-10-CM | POA: Diagnosis not present

## 2021-09-23 DIAGNOSIS — I10 Essential (primary) hypertension: Secondary | ICD-10-CM | POA: Diagnosis not present

## 2021-09-23 DIAGNOSIS — Z23 Encounter for immunization: Secondary | ICD-10-CM | POA: Diagnosis not present

## 2021-09-23 DIAGNOSIS — E039 Hypothyroidism, unspecified: Secondary | ICD-10-CM | POA: Diagnosis not present

## 2021-09-23 DIAGNOSIS — R5383 Other fatigue: Secondary | ICD-10-CM | POA: Diagnosis not present

## 2021-09-23 DIAGNOSIS — F900 Attention-deficit hyperactivity disorder, predominantly inattentive type: Secondary | ICD-10-CM | POA: Diagnosis not present

## 2021-09-23 DIAGNOSIS — I2510 Atherosclerotic heart disease of native coronary artery without angina pectoris: Secondary | ICD-10-CM | POA: Diagnosis not present

## 2021-09-23 DIAGNOSIS — R739 Hyperglycemia, unspecified: Secondary | ICD-10-CM | POA: Diagnosis not present

## 2021-09-23 DIAGNOSIS — D414 Neoplasm of uncertain behavior of bladder: Secondary | ICD-10-CM | POA: Diagnosis not present

## 2021-09-23 DIAGNOSIS — E669 Obesity, unspecified: Secondary | ICD-10-CM | POA: Diagnosis not present

## 2021-09-24 DIAGNOSIS — M25561 Pain in right knee: Secondary | ICD-10-CM | POA: Diagnosis not present

## 2021-09-24 NOTE — Telephone Encounter (Signed)
Called pt to move his Feb appt. Pt is needing to be r/s before 12/22/20 for an initial cpap appt due to pt receiving a new cpap machine.

## 2021-09-25 ENCOUNTER — Telehealth: Payer: Self-pay | Admitting: Neurology

## 2021-09-25 NOTE — Telephone Encounter (Signed)
Pt was scheduled for Initial CPAP visit on 12/10/21.  Pt was informed to bring machine and power cord to appt.  DME: [facility] Phone: [phone] Fax: [phone] Equipment issued: Melton Krebs II Pt to be schedule between: 10/24/21 and 12/22/21

## 2021-09-25 NOTE — Telephone Encounter (Signed)
Noted thanks °

## 2021-09-30 DIAGNOSIS — M25561 Pain in right knee: Secondary | ICD-10-CM | POA: Diagnosis not present

## 2021-10-02 DIAGNOSIS — M25561 Pain in right knee: Secondary | ICD-10-CM | POA: Diagnosis not present

## 2021-10-08 DIAGNOSIS — M25561 Pain in right knee: Secondary | ICD-10-CM | POA: Diagnosis not present

## 2021-10-24 DIAGNOSIS — Z96651 Presence of right artificial knee joint: Secondary | ICD-10-CM | POA: Diagnosis not present

## 2021-11-06 DIAGNOSIS — R972 Elevated prostate specific antigen [PSA]: Secondary | ICD-10-CM | POA: Diagnosis not present

## 2021-11-13 DIAGNOSIS — N403 Nodular prostate with lower urinary tract symptoms: Secondary | ICD-10-CM | POA: Diagnosis not present

## 2021-11-13 DIAGNOSIS — R972 Elevated prostate specific antigen [PSA]: Secondary | ICD-10-CM | POA: Diagnosis not present

## 2021-11-13 DIAGNOSIS — R351 Nocturia: Secondary | ICD-10-CM | POA: Diagnosis not present

## 2021-11-13 DIAGNOSIS — Z8551 Personal history of malignant neoplasm of bladder: Secondary | ICD-10-CM | POA: Diagnosis not present

## 2021-11-20 ENCOUNTER — Other Ambulatory Visit: Payer: Self-pay | Admitting: Urology

## 2021-11-20 DIAGNOSIS — R972 Elevated prostate specific antigen [PSA]: Secondary | ICD-10-CM

## 2021-11-20 DIAGNOSIS — N138 Other obstructive and reflux uropathy: Secondary | ICD-10-CM

## 2021-12-05 NOTE — Progress Notes (Signed)
Set up date for CPAP 09/23/21   [facility] [place]. [place] [address] [phone]       OFFICE FOLLOW UP NOTE  [name] Date of Birth:  [birth year] Medical Record Number:  [id number]   Primary neurologist: Dr. Rexene Wilkerson Reason for visit: CPAP compliance visit    SUBJECTIVE:   CHIEF COMPLAINT:  Chief Complaint  Patient presents with   Obstructive Sleep Apnea    Rm 2 alone Pt is well, CPAP is working great, no concerns     HPI:   Update 12/10/2021 JM: patient being seen for initial CPAP compliance visit unaccompanied after receiving a new machine on 09/23/2021. Routinely followed by Dr. Rexene Wilkerson since 2016. Doing well on new machine. Likes that is is much quieter than his prior machine. Continues use of full face mask. Continues to follow with [facility] and up-to-date on supplies.  Epworth Sleepiness Scale 1. No concerns at this time.        History provided for reference purposes only Update 01/31/2021 Dr. Rexene Wilkerson:  I reviewed his CPAP compliance data from 12/31/2020 through 01/29/2021, which is a total of 30 days, during which time he used his machine every night with percent use days greater than 4 hours at 100%, indicating superb compliance with an average usage of 7 hours and 14 minutes, residual AHI at goal at 2.4/h, leak acceptable on the higher side with a 95th percentile at 17.3 L/min on a pressure of 13 cm with EPR of 3.  He reports doing well.  He is compliant with his CPAP, sometimes the humidifier does not use up all the water and he sees water leftover in the water chamber which is unusual.  He has not changed his settings.  Other than that, he has no problems with the usage of the machine or the machine itself.  He has shaved his beard and noticed an immediate improvement in the air leakage from the mask.  He has had no changes to his medications or medical history.   ROS:   14 system review of systems performed and negative  with exception of no complaints  PMH:  Past Medical History:  Diagnosis Date   Benign localized prostatic hyperplasia with lower urinary tract symptoms (LUTS)    Bladder cancer ([place]) urolgoist-  dr [name]   History of adenomatous polyp of colon    2002  tubular adenoma   HOH (hard of hearing)    Hyperlipidemia    Hypothyroidism    OAB (overactive bladder)    OSA on CPAP    severe osa per study 05/ 2016   Wears glasses     PSH:  Past Surgical History:  Procedure Laterality Date   APPENDECTOMY     CYSTOSCOPY WITH RETROGRADE PYELOGRAM, URETEROSCOPY AND STENT PLACEMENT Right 07/07/2017   Procedure: CYSTOSCOPY;  Surgeon: Irine Seal, MD;  Location: [facility];  Service: Urology;  Laterality: Right;   CYSTOSCOPY WITH STENT PLACEMENT Right 05/28/2017   Procedure: CYSTOSCOPY;  Surgeon: Irine Seal, MD;  Location: [facility];  Service: Urology;  Laterality: Right;   KNEE ARTHROSCOPY Right 07/17/2021   w mcl repair   LUMBAR MICRODISCECTOMY  05-28-2000;  12-09-2002   L4--5 (05-28-2000)/  L5--S1 (12-09-2002)   RHINOPLASTY     TRANSURETHRAL RESECTION OF BLADDER TUMOR N/A 05/28/2017   Procedure: TRANSURETHRAL RESECTION OF BLADDER TUMOR (TURBT);  Surgeon: Irine Seal, MD;  Location: [facility];  Service: Urology;  Laterality: N/A;  TRANSURETHRAL RESECTION OF BLADDER TUMOR N/A 07/07/2017   Procedure: RESTAGING TRANSURETHRAL RESECTION OF BLADDER TUMOR (TURBT);  Surgeon: Irine Seal, MD;  Location: [facility];  Service: Urology;  Laterality: N/A;    Social History:  Social History   Socioeconomic History   Marital status: Married    Spouse name: Not on file   Number of children: 1   Years of education: BS    Highest education level: Not on file  Occupational History   Occupation: Retired  Tobacco Use   Smoking status: Former    Types: Cigarettes    Quit date: 05/20/1997    Years since quitting: 24.5   Smokeless tobacco:  Never   Tobacco comments:    Quit in 1980' s  Vaping Use   Vaping Use: Never used  Substance and Sexual Activity   Alcohol use: Yes    Alcohol/week: 3.0 standard drinks    Types: 3 Cans of beer per week   Drug use: No   Sexual activity: Not on file  Other Topics Concern   Not on file  Social History Narrative   Daily consumes coffee, tea, and some soda    Social Determinants of Health   Financial Resource Strain: Not on file  Food Insecurity: Not on file  Transportation Needs: Not on file  Physical Activity: Not on file  Stress: Not on file  Social Connections: Not on file  Intimate Partner Violence: Not on file    Family History:  Family History  Problem Relation Age of Onset   Colon cancer Mother    Heart murmur Mother    Colon cancer Father    Diabetes Father     Medications:   Current Outpatient Medications on File Prior to Visit  Medication Sig Dispense Refill   aspirin 81 MG tablet Take 81 mg by mouth daily.     ATORVASTATIN CALCIUM PO Take 40 mg by mouth every morning.      cholecalciferol (VITAMIN D) 1000 UNITS tablet Take 2,000 Units by mouth daily.     escitalopram (LEXAPRO) 20 MG tablet Take 10 mg by mouth every morning. Pt taking 1/2 tab daily     Levothyroxine Sodium (SYNTHROID PO) Take 137 mcg by mouth every morning.      lisinopril-hydrochlorothiazide (PRINZIDE,ZESTORETIC) 20-12.5 MG per tablet Take 1 tablet by mouth every morning.      methylphenidate (CONCERTA) 54 MG CR tablet Take 54 mg by mouth every morning.     No current facility-administered medications on file prior to visit.    Allergies:  No Known Allergies    OBJECTIVE:  Physical Exam  Vitals:   12/10/21 1410  BP: 125/72  Pulse: 94  Weight: 229 lb (103.9 kg)  Height: 5\' 10"  (1.778 m)   Body mass index is 32.86 kg/m. No results found.  General: well developed, well nourished, very pleasant elderly Caucasian male, seated, in no evident distress Head: head normocephalic and  atraumatic.   Neck: supple with no carotid or supraclavicular bruits Cardiovascular: regular rate and rhythm, no murmurs Musculoskeletal: no deformity Skin:  no rash/petichiae Vascular:  Normal pulses all extremities   Neurologic Exam Mental Status: Awake and fully alert. Oriented to place and time. Recent and remote memory intact. Attention span, concentration and fund of knowledge appropriate. Mood and affect appropriate.  Cranial Nerves: Pupils equal, briskly reactive to light. Extraocular movements full without nystagmus. Visual fields full to confrontation. Hearing intact. Facial sensation intact. Face, tongue, palate moves normally and symmetrically.  Motor: Normal bulk and tone. Normal strength in all tested extremity muscles Sensory.: intact to touch , pinprick , position and vibratory sensation.  Coordination: Rapid alternating movements normal in all extremities. Finger-to-nose and heel-to-shin performed accurately bilaterally. Gait and Station: Arises from chair without difficulty. Stance is normal. Gait demonstrates normal stride length and balance without use of AD.  Reflexes: 1+ and symmetric. Toes downgoing.        ASSESSMENT/PLAN: [name] is a 70 y.o. year old male with known sleep apnea followed by Dr. Rexene Wilkerson since 2016. Recently received new CPAP machine on 09/23/2021 - here for initial compliance visit.     -Excellent compliance with optimal residual AHI -Continue current settings -Continue follow-up with [facility] for any needed supplies or CPAP related concerns   Orders Placed This Encounter  Procedures   For home use only DME continuous positive airway pressure (CPAP)    Continue set pressure at [employer] as needed    Order Specific Question:   Length of Need    Answer:   Lifetime    Order Specific Question:   Patient has OSA or probable OSA    Answer:   Yes    Order Specific Question:   Is the patient currently using CPAP in the home     Answer:   Yes    Order Specific Question:   If yes (to question two)    Answer:   Determine DME provider and inform them of any new orders/settings    Order Specific Question:   Date of face to face encounter    Answer:   12/10/2020    Order Specific Question:   Settings    Answer:   Other see comments    Order Specific Question:   CPAP supplies needed    Answer:   Mask, headgear, cushions, filters, heated tubing and water chamber      Follow up in 1 year or call earlier if needed    CC:  PCP: Shon Baton, MD    I spent 23 minutes of face-to-face and non-face-to-face time with patient.  This included previsit chart review, lab review, study review, order entry, electronic health record documentation, patient education and discussion regarding history of sleep apnea, review and discussion of recent compliance report, importance of continue nightly usage and answered all other questions to patient satisfaction   Frann Rider, AGNP-BC  [facility] [address] [place], [place] [address]  Phone [phone] Fax [phone] Note: This document was prepared with digital dictation and possible smart phrase technology. Any transcriptional errors that result from this process are unintentional.

## 2021-12-10 ENCOUNTER — Ambulatory Visit: Payer: Medicare Other | Admitting: Neurology

## 2021-12-10 ENCOUNTER — Ambulatory Visit (INDEPENDENT_AMBULATORY_CARE_PROVIDER_SITE_OTHER): Payer: Medicare Other | Admitting: Adult Health

## 2021-12-10 ENCOUNTER — Encounter: Payer: Self-pay | Admitting: Adult Health

## 2021-12-10 VITALS — BP 125/72 | HR 94 | Ht 70.0 in | Wt 229.0 lb

## 2021-12-10 DIAGNOSIS — G4733 Obstructive sleep apnea (adult) (pediatric): Secondary | ICD-10-CM | POA: Diagnosis not present

## 2021-12-10 DIAGNOSIS — Z9989 Dependence on other enabling machines and devices: Secondary | ICD-10-CM | POA: Diagnosis not present

## 2021-12-10 NOTE — Patient Instructions (Signed)
Your Plan:  Continue current excellent compliance of CPAP for adequate management of sleep apnea  Continue to follow with your DME company for any needed supplies or CPAP related concerns    Follow-up in 1 year or call earlier if needed     Thank you for coming to see [place] at [facility]. I hope we have been able to provide you high quality care today.  You may receive a patient satisfaction survey over the next few weeks. We would appreciate your feedback and comments so that we may continue to improve ourselves and the health of our patients.

## 2021-12-18 ENCOUNTER — Ambulatory Visit
Admission: RE | Admit: 2021-12-18 | Discharge: 2021-12-18 | Disposition: A | Discharge status: Home / Self Care | Payer: Medicare Other | Source: Ambulatory Visit | Attending: Urology | Admitting: Urology

## 2021-12-18 DIAGNOSIS — N400 Enlarged prostate without lower urinary tract symptoms: Secondary | ICD-10-CM | POA: Diagnosis not present

## 2021-12-18 DIAGNOSIS — N403 Nodular prostate with lower urinary tract symptoms: Secondary | ICD-10-CM

## 2021-12-18 DIAGNOSIS — N138 Other obstructive and reflux uropathy: Secondary | ICD-10-CM

## 2021-12-18 DIAGNOSIS — R972 Elevated prostate specific antigen [PSA]: Secondary | ICD-10-CM

## 2021-12-18 MED ORDER — GADOBENATE DIMEGLUMINE 529 MG/ML IV SOLN
20.0000 mL | Freq: Once | INTRAVENOUS | Status: AC | PRN
  Administered 2021-12-18: 20 mL via INTRAVENOUS

## 2022-01-08 DIAGNOSIS — D2362 Other benign neoplasm of skin of left upper limb, including shoulder: Secondary | ICD-10-CM | POA: Diagnosis not present

## 2022-01-08 DIAGNOSIS — L821 Other seborrheic keratosis: Secondary | ICD-10-CM | POA: Diagnosis not present

## 2022-01-08 DIAGNOSIS — L820 Inflamed seborrheic keratosis: Secondary | ICD-10-CM | POA: Diagnosis not present

## 2022-01-08 DIAGNOSIS — L814 Other melanin hyperpigmentation: Secondary | ICD-10-CM | POA: Diagnosis not present

## 2022-01-08 DIAGNOSIS — D225 Melanocytic nevi of trunk: Secondary | ICD-10-CM | POA: Diagnosis not present

## 2022-01-08 DIAGNOSIS — D1801 Hemangioma of skin and subcutaneous tissue: Secondary | ICD-10-CM | POA: Diagnosis not present

## 2022-01-08 DIAGNOSIS — Z85828 Personal history of other malignant neoplasm of skin: Secondary | ICD-10-CM | POA: Diagnosis not present

## 2022-01-10 DIAGNOSIS — R972 Elevated prostate specific antigen [PSA]: Secondary | ICD-10-CM | POA: Diagnosis not present

## 2022-01-10 DIAGNOSIS — C61 Malignant neoplasm of prostate: Secondary | ICD-10-CM | POA: Diagnosis not present

## 2022-01-10 DIAGNOSIS — N4289 Other specified disorders of prostate: Secondary | ICD-10-CM | POA: Diagnosis not present

## 2022-01-21 DIAGNOSIS — C61 Malignant neoplasm of prostate: Secondary | ICD-10-CM | POA: Diagnosis not present

## 2022-02-03 ENCOUNTER — Ambulatory Visit: Payer: Medicare Other | Admitting: Neurology

## 2022-02-07 DIAGNOSIS — R350 Frequency of micturition: Secondary | ICD-10-CM | POA: Diagnosis not present

## 2022-02-07 DIAGNOSIS — N403 Nodular prostate with lower urinary tract symptoms: Secondary | ICD-10-CM | POA: Diagnosis not present

## 2022-02-07 DIAGNOSIS — C61 Malignant neoplasm of prostate: Secondary | ICD-10-CM | POA: Diagnosis not present

## 2022-02-07 DIAGNOSIS — Z8551 Personal history of malignant neoplasm of bladder: Secondary | ICD-10-CM | POA: Diagnosis not present

## 2022-02-07 DIAGNOSIS — R972 Elevated prostate specific antigen [PSA]: Secondary | ICD-10-CM | POA: Diagnosis not present

## 2022-02-17 NOTE — Progress Notes (Signed)
I called pt to introduce myself as the Prostate Nurse Navigator and the Coordinator of the Prostate [place]. ?  ?1. I confirmed with the patient he is aware of his referral to the clinic on 3/28, arriving @ 12:30 pm.  ?  ?2. I discussed the format of the clinic and the physicians he will be seeing that day. ?  ?3. I discussed where the clinic is located and how to contact me. ?  ?4. I confirmed his address and informed him I would be mailing a packet of information and forms to be completed. I asked him to bring them with him the day of his appointment.  ?  ?He voiced understanding of the above. I asked him to call me if he has any questions or concerns regarding his appointments or the forms he needs to complete.  ?

## 2022-02-26 DIAGNOSIS — Z20822 Contact with and (suspected) exposure to covid-19: Secondary | ICD-10-CM | POA: Diagnosis not present

## 2022-03-03 NOTE — Progress Notes (Signed)
RN spoke with patient, patient aware of appointment for the [facility] on 3/28 @ 12:30.  Pt confirmed he did receive paperwork, and will bring to upcoming appointment.  No further needs at this time.  ?

## 2022-03-04 ENCOUNTER — Ambulatory Visit (HOSPITAL_BASED_OUTPATIENT_CLINIC_OR_DEPARTMENT_OTHER): Payer: Medicare Other | Admitting: Genetic Counselor

## 2022-03-04 ENCOUNTER — Inpatient Hospital Stay: Payer: Medicare Other | Attending: Oncology | Admitting: Oncology

## 2022-03-04 ENCOUNTER — Ambulatory Visit
Admission: RE | Admit: 2022-03-04 | Discharge: 2022-03-04 | Disposition: A | Discharge status: Home / Self Care | Payer: Medicare Other | Source: Ambulatory Visit | Attending: Radiation Oncology | Admitting: Radiation Oncology

## 2022-03-04 ENCOUNTER — Other Ambulatory Visit: Payer: Self-pay | Admitting: Genetic Counselor

## 2022-03-04 ENCOUNTER — Other Ambulatory Visit: Payer: Self-pay

## 2022-03-04 ENCOUNTER — Inpatient Hospital Stay: Payer: Medicare Other

## 2022-03-04 DIAGNOSIS — C61 Malignant neoplasm of prostate: Secondary | ICD-10-CM

## 2022-03-04 DIAGNOSIS — Z87891 Personal history of nicotine dependence: Secondary | ICD-10-CM | POA: Diagnosis not present

## 2022-03-04 DIAGNOSIS — Z800 Family history of malignant neoplasm of digestive organs: Secondary | ICD-10-CM

## 2022-03-04 DIAGNOSIS — Z8551 Personal history of malignant neoplasm of bladder: Secondary | ICD-10-CM | POA: Diagnosis not present

## 2022-03-04 DIAGNOSIS — Z1379 Encounter for other screening for genetic and chromosomal anomalies: Secondary | ICD-10-CM

## 2022-03-04 DIAGNOSIS — Z8546 Personal history of malignant neoplasm of prostate: Secondary | ICD-10-CM | POA: Diagnosis not present

## 2022-03-04 LAB — GENETIC SCREENING ORDER

## 2022-03-04 NOTE — Consult Note (Signed)
?Multi-Disciplinary Clinic 03/04/2022  ?  ?[name]         ?MRN: [id number]  PRIMARY CARE:  Eric Reel, MD  ?  REFERRING:  Eric Seal, MD  ?  PROVIDER:  Irine Wilkerson, M.D.  ?DOB: [birth year], 71 year old Male  TREATING:  [name], M.D.  ?SSN: -**[phone]  LOCATION:  [name] [phone]  ?  ?CC/HPI: CC: Prostate Cancer  ? ?Physician requesting consult: Dr. Irine Wilkerson  ?PCP: Dr. Shon Wilkerson  ?Location of consult: [facility]  ? ?[name] is a 71 year old gentleman with a past medical history positive for hyperlipidemia, hypertension, anxiety, sleep apnea, and hypothyroidism. He also has a history of high grade, T1 bladder cancer. He was treated with TURBT in June 2018 with pathology indicating high grade, T1 disease. He underwent re-resection in July 2018 without residual disease noted. He completed adjuvant induction BCG intravesical therapy in October 2018 and then proceeded with maintenance therapy which was stopped in 2020. He has had no evidence of recurrence of his urothelial cancer and underwent his most recent surveillance cystoscopy by Dr. Jeffie Wilkerson in December 2022 that was negative for recurrence. His PSA had been noted to be increasing and was 5.0 in November 2022. He underwent an MRI of the prostate on 12/18/21 that demonstrated a 7 mm PI-RADS 4 lesion at the right base, 1 cm PI-RADS 4 lesion at the left apex, and a 6 mm PI-RADS 3 lesion at the right mid gland. He then proceeded with a MR/US fusion biopsy on 01/10/22 which confirmed Gleason 3+4=7 adenocarcinoma with 3 of the 9 targeted biopsies positive for Gleason 3+3=6 disease and 3 out of 12 systematic biopsies positive with only one of those noted to have Gleason 3+4=7 disease. Prolaris genomic testing was performed and indicated a 2.9% risk of prostate cancer mortality in 10 years without treatment and a 1.5% risk of developing metastatic disease at 10 years with active  treatment.  ? ?Family history: None.  ? ?Imaging studies: MRI (12/18/21): No EPE, SVI, LAD, or bone lesions.  ? ?PMH: He has a history of hyperlipidemia, hypertension, anxiety, hypothyroidism, sleep apnea, and bladder cancer.  ?PSH: Open appendectomy.  ? ?TNM stage: cT1c N0 Mx  ?PSA: 5.0  ?Gleason score: 3+4=7 (GG 2)  ?Biopsy (01/10/22): 6/21 cores positive  ?Left: L lateral base (10%, 3+3=6)  ?Right: R base (5%, 3+3=6), R lateral base (20%, 3+4=7, PNI)  ?MR targets: ROI 1 (1/3 cores, 10%, 3+3=6), ROI 2 (2/3 cores, 5%, 5%, 3+3=6), ROI 3 (negative)  ?Prostate volume: 49 cc  ?PSAD: 0.10  ? ?Nomogram  ?OC disease: 54%  ?EPE: 44%  ?SVI: 4%  ?LNI: 5%  ?PFS (5 year, 10 year): 79%, 67%  ? ?Urinary function: IPSS is 9.  ?Erectile function: SHIM score is 2. He and his wife are not sexually active.  ? ?  ?ALLERGIES: No Allergies    ?MEDICATIONS: Atorvastatin Calcium 40 mg tablet  ?Escitalopram Oxalate 20 mg tablet  ?Levofloxacin 750 mg tablet 1 po 1 hour prior to the procedure  ?Lisinopril-Hydrochlorothiazide 20 mg-12.5 mg tablet  ?Methylphenidate Er 54 mg tablet, extended release 24 hr  ?Synthroid 137 mcg tablet  ?Vitamin B12  ?   ?GU PSH: Bladder Instill [place], 2019, 2019, 2019, 2019, 2019, 2019, 2018, 2018, 2018, 2018, 2018, 2018 ?Cystoscopy - 11/13/2021, 09/25/2020, 2021, 2020, 2020, 2020, 2019, 2019, 2019, 2018 ?Cystoscopy TURBT >5 cm -  2018, 2018 ?Locm 300-'399Mg'$ /Ml Iodine,1Ml - 2018 ?Prostate Needle Biopsy - 01/10/2022 ? ?   ?NON-GU PSH: Appendectomy (open) ?Back surgery ?Knee Arthroscopy/surgery, MCL ?Surgical Pathology, Gross And Microscopic Examination For Prostate Needle - 01/10/2022 ? ?   ? ?   ?GU PMH: Elevated PSA - 02/07/2022, - 01/10/2022, His PSA is up a bit but he had a bad URI a few weeks ago. 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 I will get a prostate MRI and have him return in 3 months with a repeat PSA unless the MRI suggests a need to do a biopsy., - 11/13/2021, - 09/25/2020,  His PSA is up over 4 with a low f/t ratio. I am going to repeat the PSA and if it remains elevated, I will have him return for a prostate [place] and biopsy. I have reviewed the risks of bleeding, infection and voiding difficulty. , - 2018 ?History of bladder cancer, Negative cystoscopy in 12/22. He will be due for surveillance in 12/23. - 02/07/2022, No recurrence seen. cytology ordered. Cysto in a year. , - 11/13/2021, No recurrent tumors seen. Cytology sent. F/U in 68yr , - 09/25/2020, No recurrences. F/u in 3 months for cystoscopy. Cytology today., - 2018 ?Prostate Cancer, He has T2a Nx Mx GG1 and 2 disease with only one low volume core of GG2. He has a Prolaris score of 3.8 and falls into the borderline of AS and Single modal therapy on that test. He has mild/mod LUTS and severe ED. He has a history of T1 HGNMIBC that has been treated with BCG and maintenance in the past but no with no recurrence since 2018. I have reviewed multiple treatment options with him including Surveillance, RALP, Seeds and EXRT. I reviewed the risks of these in detail and also the risks of SpaceOAR. I briefly reviewed Cryo and HIFU but that we don't generally utilize those for primary treatment in out practice. He is most interested in a seed implant, but I will get him seen in the [name] further review his options, particularly in the face of his bladder cancer history. - 02/07/2022 ?Prostate nodule w/ LUTS - 02/07/2022, hard and gritty in left mid medial prostate. , - 11/13/2021 ?Urinary Frequency, He has some increased irritative symptoms since his biopsy. - 02/07/2022, - 2018 ?Nocturia - 11/13/2021, - 09/25/2020, - 2021 ?BPH w/LUTS, He has stable LUTS and his PSA is stable. - 09/25/2020, He has LUTs with OAB. He wiill have an IPSS, flowrate and PVR as part of his evaluation. , - 2018 ?Bladder Cancer overlapping sites, [name] no residual disease on reresection and has no ureteral obstruction. He has had significant postop irritative symptoms but  they are slowing improving and his UA doesn't look infected. I am going to get him started on BCG therapy in about 2 weeks. He will see me in about 4 months for cystoscopy and cytology. 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 I am going to get him set up for restaging in 3-4 weeks and will do a TURBT with possible right ureteroscopy and stent. I will get a culture a week before. He will need BCG therapy afterwards. We discussed cystectomy but I think bladder preservation is his best first option. , - 2018 ?Microscopic hematuria, He had 5-10 RBC's on UA and I will have him return for a CT hematuria study and cystoscopy. - 2018 ?Urinary Urgency - 2018 ?   ?NON-GU PMH: Anxiety ?GERD ?Hypercholesterolemia ?  Hypertension ?Hypothyroidism ?Sleep Apnea ?   ?FAMILY HISTORY: 1 son - Other ?Colon Cancer - Mother, Father ?heart failure - Mother, Father   ?SOCIAL HISTORY: Marital Status: Married ?Race: White ?Current Smoking Status: Patient does not smoke anymore. Has not smoked since 04/07/1997.  ?<DIV'  ?Tobacco Use Assessment Completed:  ?Used Tobacco in last 30 days?  ? ?Drinks 3 caffeinated drinks per day. ?   ? Notes: Retired   ?VITAL SIGNS: None    ?MULTI-SYSTEM PHYSICAL EXAMINATION:     ?Constitutional: Well-nourished. No physical deformities. Normally developed. Good grooming.    ? ?    ? ?Complexity of Data:  ? ?Lab Test Review:  PSA  ?Records Review:  Pathology Reports, Previous Patient Records  ? ? 11/06/21 09/18/20 02/15/20 05/11/19 03/04/19 08/11/18 02/15/18 04/23/17  ?PSA  ?Total PSA 5.00 ng/mL 3.92 ng/mL 3.64 ng/mL 4.45 ng/mL 5.34 ng/dl 3.15 ng/mL 4.73 ng/dl 2.34   ?Free PSA 1.09 ng/mL         ?% Free PSA 22 % PSA         ? ? ?PROCEDURES: None  ? ?ASSESSMENT:  ? ?  ICD-10 Details  ?1 GU:  Prostate Cancer - C61   ? ?PLAN:   ?Document  ?Letter(s):  Created for Patient: Clinical Summary  ? ?Notes:  1. Favorable intermediate risk prostate cancer: I had a detailed discussion with Mr. Mrs. Delellis today regarding  his prostate cancer situation. Taking into account his life expectancy and his disease parameters as well as his recent genomic testing, we did discuss active surveillance in detail is a very reasonable

## 2022-03-04 NOTE — Progress Notes (Incomplete)
Pi Rads 4 ? ? ? ? ? ?

## 2022-03-04 NOTE — Progress Notes (Signed)
?Reason for the request:    Prostate cancer ? ?HPI: I was asked by Dr. Fredrich Birks to evaluate [name] for the diagnosis of prostate cancer.  He is a 71 year old man with history of T1 high-grade bladder cancer diagnosed in 2018.  He was treated with BCG induction followed by maintenance.  He was found to have an elevated PSA up to 5 and underwent an MRI for evaluation.  The MRI showed a PI-RADS 4 on each side and a PI-RADS 3 on the right gland.  Based on these findings he underwent a prostate biopsy which showed a Gleason score 3+4 equal 7 and 1 core and Gleason 6 in the remaining cores.  Clinically, he has no complaints at this time.  He denies any hematochezia or melena.  He denies any hemoptysis or hematemesis.  He denies any frequency urgency or hesitancy.  His performance status remains excellent. ? ?He does not report any headaches, blurry vision, syncope or seizures. Does not report any fevers, chills or sweats.  Does not report any cough, wheezing or hemoptysis.  Does not report any chest pain, palpitation, orthopnea or leg edema.  Does not report any nausea, vomiting or abdominal pain.  Does not report any constipation or diarrhea.  Does not report any skeletal complaints.    Does not report frequency, urgency or hematuria.  Does not report any skin rashes or lesions. Does not report any heat or cold intolerance.  Does not report any lymphadenopathy or petechiae.  Does not report any anxiety or depression.  Remaining review of systems is negative.  ? ? ? ?Past Medical History:  ?Diagnosis Date  ? Benign localized prostatic hyperplasia with lower urinary tract symptoms (LUTS)   ? Bladder cancer ([place]) urolgoist-  dr Jeffie Pollock  ? History of adenomatous polyp of colon   ? 2002  tubular adenoma  ? HOH (hard of hearing)   ? Hyperlipidemia   ? Hypothyroidism   ? OAB (overactive bladder)   ? OSA on CPAP   ? severe osa per study 05/ 2016  ? Wears glasses   ?: ? ? ?Past Surgical History:  ?Procedure Laterality Date  ?  APPENDECTOMY    ? CYSTOSCOPY WITH RETROGRADE PYELOGRAM, URETEROSCOPY AND STENT PLACEMENT Right 07/07/2017  ? Procedure: CYSTOSCOPY;  Surgeon: Irine Seal, MD;  Location: [facility];  Service: Urology;  Laterality: Right;  ? CYSTOSCOPY WITH STENT PLACEMENT Right 05/28/2017  ? Procedure: CYSTOSCOPY;  Surgeon: Irine Seal, MD;  Location: [facility];  Service: Urology;  Laterality: Right;  ? KNEE ARTHROSCOPY Right 07/17/2021  ? w mcl repair  ? LUMBAR MICRODISCECTOMY  05-28-2000;  12-09-2002  ? L4--5 (05-28-2000)/  L5--S1 (12-09-2002)  ? RHINOPLASTY    ? TRANSURETHRAL RESECTION OF BLADDER TUMOR N/A 05/28/2017  ? Procedure: TRANSURETHRAL RESECTION OF BLADDER TUMOR (TURBT);  Surgeon: Irine Seal, MD;  Location[facility];  Service: Urology;  Laterality: N/A;  ? TRANSURETHRAL RESECTION OF BLADDER TUMOR N/A 07/07/2017  ? Procedure: RESTAGING TRANSURETHRAL RESECTION OF BLADDER TUMOR (TURBT);  Surgeon: Irine Seal, MD;  Location: [facility];  Service: Urology;  Laterality: N/A;  ?: ? ? ?Current Outpatient Medications:  ?  aspirin 81 MG tablet, Take 81 mg by mouth daily., Disp: , Rfl:  ?  ATORVASTATIN CALCIUM PO, Take 40 mg by mouth every morning. , Disp: , Rfl:  ?  cholecalciferol (VITAMIN D) 1000 UNITS tablet, Take 2,000 Units by mouth daily., Disp: , Rfl:  ?  escitalopram (  LEXAPRO) 20 MG tablet, Take 10 mg by mouth every morning. Pt taking 1/2 tab daily, Disp: , Rfl:  ?  Levothyroxine Sodium (SYNTHROID PO), Take 137 mcg by mouth every morning. , Disp: , Rfl:  ?  lisinopril-hydrochlorothiazide (PRINZIDE,ZESTORETIC) 20-12.5 MG per tablet, Take 1 tablet by mouth every morning. , Disp: , Rfl:  ?  methylphenidate (CONCERTA) 54 MG CR tablet, Take 54 mg by mouth every morning., Disp: , Rfl: : ? ?No Known Allergies: ? ? ?Family History  ?Problem Relation Age of Onset  ? Colon cancer Mother   ? Heart murmur Mother   ? Colon cancer Father   ? Diabetes Father    ?: ? ? ?Social History  ? ?Socioeconomic History  ? Marital status: Married  ?  Spouse name: Not on file  ? Number of children: 1  ? Years of education: BS   ? Highest education level: Not on file  ?Occupational History  ? Occupation: Retired  ?Tobacco Use  ? Smoking status: Former  ?  Types: Cigarettes  ?  Quit date: 05/20/1997  ?  Years since quitting: 24.8  ? Smokeless tobacco: Never  ? Tobacco comments:  ?  Quit in 1980' s  ?Vaping Use  ? Vaping Use: Never used  ?Substance and Sexual Activity  ? Alcohol use: Yes  ?  Alcohol/week: 3.0 standard drinks  ?  Types: 3 Cans of beer per week  ? Drug use: No  ? Sexual activity: Not on file  ?Other Topics Concern  ? Not on file  ?Social History Narrative  ? Daily consumes coffee, tea, and some soda   ? ?Social Determinants of Health  ? ?Financial Resource Strain: Not on file  ?Food Insecurity: Not on file  ?Transportation Needs: Not on file  ?Physical Activity: Not on file  ?Stress: Not on file  ?Social Connections: Not on file  ?Intimate Partner Violence: Not on file  ?: ? ?Pertinent items are noted in HPI. ? ?Exam: ? ?General appearance: alert and cooperative appeared without distress. ?Head: atraumatic without any abnormalities. ?Eyes: conjunctivae/corneas clear. PERRL.  Sclera anicteric. ?Throat: lips, mucosa, and tongue normal; without oral thrush or ulcers. ?Resp: clear to auscultation bilaterally without rhonchi, wheezes or dullness to percussion. ?Cardio: regular rate and rhythm, S1, S2 normal, no murmur, click, rub or gallop ?GI: soft, non-tender; bowel sounds normal; no masses,  no organomegaly ?Skin: Skin color, texture, turgor normal. No rashes or lesions ?Lymph nodes: Cervical, supraclavicular, and axillary nodes normal. ?Neurologic: Grossly normal without any motor, sensory or deep tendon reflexes. ?Musculoskeletal: No joint deformity or effusion. ? ? ? ?Assessment and Plan:  ? ? ?71 year old with prostate cancer diagnosed in in February 2023.  He was found  to have Gleason score 3+4 equal 7 and a PSA of 5 with favorable intermediate risk disease. ? ?This case was discussed today in the prostate cancer multidisciplinary clinic including review of his imaging studies with radiology and pathology results with reviewing pathologist.  Treatment choices including definitive therapy with radiation versus active surveillance were debated.  The role for systemic therapy was discussed but at this time deferred unless he has advanced disease in the future. ? ?After discussion today, he is favoring proceeding with definitive therapy and considering radiation therapy with seed implants under the care of Dr. Tammi Klippel.  All his questions were answered to his satisfaction. ? ? ?30  minutes were dedicated to this visit. The time was spent on reviewing laboratory data, imaging studies, discussing treatment options,  discussing pathology results and answering questions regarding future plan. ? ? ?  A copy of this consult has been forwarded to the requesting physician. ? ?

## 2022-03-04 NOTE — Progress Notes (Signed)
? ?                              Care Plan Summary ? ?Name: Merrel Crabbe ?DOB: 01/30/1951  ? ?Your Medical Team:  ? Urologist -  Dr. Raynelle Bring, [facility] ? Radiation Oncologist - Dr. Tyler Pita, [place]  ? Medical Oncologist - Dr. Zola Button, [place] ? ?Recommendations: ?1) Active Surveillance   ?2) Brachytherapy  ? ? ?* These recommendations are based on information available as of today?s consult.      Recommendations may change depending on the results of further tests or exams. ? ? ? ?Next Steps: ?1) Consider your options and contact Kathlee Nations with your treatment decision.   ? ? ?When appointments need to be scheduled, you will be contacted by [facility] and/or [facility]. ? ?Questions?  Please do not hesitate to call Katheren Puller, BSN, RN at [phone] with any questions or concerns.  Kathlee Nations is your Oncology Nurse Navigator and is available to assist you while you?re receiving your medical care at [facility].   ?

## 2022-03-04 NOTE — Progress Notes (Signed)
?Radiation Oncology         ([phone]) [phone] ?________________________________ ? ?Multidisciplinary Prostate Cancer Clinic ? ?Initial Radiation Oncology Consultation ? ?Name: [name] MRN: [id number]  ?Date: 03/04/2022  DOB: 11-18-1951 ? ?NO:IBBCW, Eric Reichmann, MD  Eric Seal, MD  ? ?REFERRING PHYSICIAN: Irine Seal, MD ? ?DIAGNOSIS: 71 y.o. gentleman with stage T1c adenocarcinoma of the prostate with a Gleason's score of 3+4 and a PSA of 5 ? ?  ICD-10-CM   ?1. Malignant neoplasm of prostate ([place])  C61   ?  ? ? ?HISTORY OF PRESENT ILLNESS::[name] is a 71 y.o. gentleman with a history of bladder cancer diagnosed and treated in 2018.  He is continued in routine surveillance under the care and direction of Dr. Jeffie Pollock with repeat office cystoscopies and CT imaging without any evidence of recurrent disease.  His PSA has fluctuated over the years on BCG, noted at 5.34 in March 2020, 4.45 in June 2020, 3.92 in October 2021 and most recently, 5.0 in 10/2021. Digital rectal examination was performed at the time of routine follow-up in 10/2021 revealing a new 5 mm left mid gland prostate nodule. He underwent prostate MRI on 12/18/21 showing bilateral PI-RADS 4 lesions and a single PI-RADS 3 lesion, all in the peripheral zone. The patient proceeded to MRI fusion transrectal ultrasound with 20 biopsies of the prostate on 01/10/22.  The prostate volume measured 49 cc.  Out of 20 core biopsies, 6 were positive.  The maximum Gleason score was 3+4, and this was seen in the right base lateral. Additionally, small foci (10% or less) of Gleason 3+3 were seen in one sample from ROI #1, two samples from ROI #2, as well as the left base lateral, and right base. ? ?The patient reviewed the biopsy results with his urologist and he has kindly been referred today to the multidisciplinary prostate cancer clinic for presentation of pathology and radiology studies in our conference for discussion of potential radiation treatment options and  clinical evaluation. ? ?PREVIOUS RADIATION THERAPY: No ? ?PAST MEDICAL HISTORY:  has a past medical history of Benign localized prostatic hyperplasia with lower urinary tract symptoms (LUTS), Bladder cancer ([place]) (urolgoist-  dr Jeffie Pollock), History of adenomatous polyp of colon, HOH (hard of hearing), Hyperlipidemia, Hypothyroidism, OAB (overactive bladder), OSA on CPAP, and Wears glasses.   ? ?PAST SURGICAL HISTORY: ?Past Surgical History:  ?Procedure Laterality Date  ? APPENDECTOMY    ? CYSTOSCOPY WITH RETROGRADE PYELOGRAM, URETEROSCOPY AND STENT PLACEMENT Right 07/07/2017  ? Procedure: CYSTOSCOPY;  Surgeon: Eric Seal, MD;  [facility]: [facility];  Service: Urology;  Laterality: Right;  ? CYSTOSCOPY WITH STENT PLACEMENT Right 05/28/2017  ? Procedure: CYSTOSCOPY;  Surgeon: Eric Seal, MD;  Location: [facility];  Service: Urology;  Laterality: Right;  ? KNEE ARTHROSCOPY Right 07/17/2021  ? w mcl repair  ? LUMBAR MICRODISCECTOMY  05-28-2000;  12-09-2002  ? L4--5 (05-28-2000)/  L5--S1 (12-09-2002)  ? RHINOPLASTY    ? TRANSURETHRAL RESECTION OF BLADDER TUMOR N/A 05/28/2017  ? Procedure: TRANSURETHRAL RESECTION OF BLADDER TUMOR (TURBT);  Surgeon: Eric Seal, MD;  Location: [facility];  Service: Urology;  Laterality: N/A;  ? TRANSURETHRAL RESECTION OF BLADDER TUMOR N/A 07/07/2017  ? Procedure: RESTAGING TRANSURETHRAL RESECTION OF BLADDER TUMOR (TURBT);  Surgeon: Eric Seal, MD;  Location: [facility];  Service: Urology;  Laterality: N/A;  ? ? ?FAMILY HISTORY: family history includes Colon cancer in his father and mother; Diabetes in his father; Heart murmur  in his mother. ? ?SOCIAL HISTORY:  reports that he quit smoking about 24 years ago. He has never used smokeless tobacco. He reports current alcohol use of about 3.0 standard drinks per week. He reports that he does not use drugs. ? ?ALLERGIES: Patient has no known allergies. ? ?MEDICATIONS:  ?Current  Outpatient Medications  ?Medication Sig Dispense Refill  ? aspirin 81 MG tablet Take 81 mg by mouth daily.    ? ATORVASTATIN CALCIUM PO Take 40 mg by mouth every morning.     ? cholecalciferol (VITAMIN D) 1000 UNITS tablet Take 2,000 Units by mouth daily.    ? escitalopram (LEXAPRO) 20 MG tablet Take 10 mg by mouth every morning. Pt taking 1/2 tab daily    ? Levothyroxine Sodium (SYNTHROID PO) Take 137 mcg by mouth every morning.     ? lisinopril-hydrochlorothiazide (PRINZIDE,ZESTORETIC) 20-12.5 MG per tablet Take 1 tablet by mouth every morning.     ? methylphenidate (CONCERTA) 54 MG CR tablet Take 54 mg by mouth every morning.    ? ?No current facility-administered medications for this encounter.  ? ? ?REVIEW OF SYSTEMS:  On review of systems, the patient reports that he is doing well overall. He denies any chest pain, shortness of breath, cough, fevers, chills, night sweats, unintended weight changes. He denies any bowel disturbances, and denies abdominal pain, nausea or vomiting. He denies any new musculoskeletal or joint aches or pains. His IPSS was 9, indicating mild urinary symptoms. His SHIM was 11, indicating he has moderate erectile dysfunction. A complete review of systems is obtained and is otherwise negative.  ? ?PHYSICAL EXAM:  ?Wt Readings from Last 3 Encounters:  ?03/04/22 232 lb (105.2 kg)  ?12/10/21 229 lb (103.9 kg)  ?01/31/21 243 lb 5 oz (110.4 kg)  ? ?Temp Readings from Last 3 Encounters:  ?03/04/22 97.6 ?F (36.4 ?C)  ?01/31/20 (!) 96.5 ?F (35.8 ?C) (Temporal)  ?07/07/17 98 ?F (36.7 ?C)  ? ?BP Readings from Last 3 Encounters:  ?03/04/22 123/70  ?12/10/21 125/72  ?01/31/21 133/79  ? ?Pulse Readings from Last 3 Encounters:  ?03/04/22 77  ?12/10/21 94  ?01/31/21 75  ? ? /10 ? ?In general this is a well appearing Caucasian male in no acute distress.  He's alert and oriented x4 and appropriate throughout the examination. Cardiopulmonary assessment is negative for acute distress and he exhibits normal  effort.  ? ? ?KPS = 100 ? ?100 - Normal; no complaints; no evidence of disease. ?90   - Able to carry on normal activity; minor signs or symptoms of disease. ?80   - Normal activity with effort; some signs or symptoms of disease. ?22   - Cares for self; unable to carry on normal activity or to do active work. ?60   - Requires occasional assistance, but is able to care for most of his personal needs. ?50   - Requires considerable assistance and frequent medical care. ?77   - Disabled; requires special care and assistance. ?30   - Severely disabled; hospital admission is indicated although death not imminent. ?20   - Very sick; hospital admission necessary; active supportive treatment necessary. ?10   - Moribund; fatal processes progressing rapidly. ?0     - Dead ? ?Karnofsky DA, Abelmann WH, Craver LS and Burchenal [facility] [phone]) The use of the nitrogen mustards in the palliative treatment of carcinoma: with particular reference to bronchogenic carcinoma Cancer 1 [phone] ? ? ?LABORATORY DATA:  ?Lab Results  ?Component Value Date  ?  HGB 13.9 07/07/2017  ? HCT 41.0 07/07/2017  ? ?Lab Results  ?Component Value Date  ? NA 141 07/07/2017  ? K 3.4 (L) 07/07/2017  ? CL 105 07/07/2017  ? ?No results found for: ALT, AST, GGT, ALKPHOS, BILITOT ?  ?RADIOGRAPHY: No results found. ?   ?IMPRESSION/PLAN: 71 y.o. gentleman with Stage T2a adenocarcinoma of the prostate with a Gleason score of 3+4 and a PSA of 5.   ? ?We discussed the patient's workup and outlined the nature of prostate cancer in this setting. The patient's T stage, Gleason's score, and PSA put him into the favorable intermediate risk group. Accordingly, he is eligible for a variety of potential treatment options including active surveillance, brachytherapy, 5.5 weeks of external radiation, or prostatectomy. We discussed the available radiation techniques, and focused on the details and logistics of delivery. We discussed and outlined the risks, benefits, short and  long-term effects associated with radiotherapy and compared and contrasted these with prostatectomy. We discussed the role of SpaceOAR gel in reducing the rectal toxicity associated with radiotherapy. He appears t

## 2022-03-05 ENCOUNTER — Encounter: Payer: Self-pay | Admitting: Genetic Counselor

## 2022-03-05 NOTE — Progress Notes (Signed)
REFERRING PROVIDER: ?Zola Button, MD ?[address] ?[place], [place] [address] ? ?PRIMARY PROVIDER:  ?Shon Baton, MD ? ?PRIMARY REASON FOR VISIT:  ?1. Malignant neoplasm of prostate ([place])   ?2. Family history of colon cancer   ?3. Family history of stomach cancer   ? ? ?HISTORY OF PRESENT ILLNESS:   ?[name], a 71 y.o. male, was seen for a [facility] cancer genetics consultation at the request of Dr. Alen Blew due to a personal and family history of cancer.  [name] presents to clinic today to discuss the possibility of a hereditary predisposition to cancer, to discuss genetic testing, and to further clarify his future cancer risks, as well as potential cancer risks for family members.  ? ?In 2018, at the age of 66, [name] was diagnosed with bladder cancer. In 2023, at the age of 64, [name] was diagnosed with prostate cancer.  ? ?CANCER HISTORY:  ?Oncology History  ?Malignant neoplasm of prostate ([place])  ?01/10/2022 Cancer Staging  ? Staging form: Prostate, AJCC 8th Edition ?- Clinical stage from 01/10/2022: Stage IIB (cT1c, cN0, cM0, PSA: 5, Grade Group: 2) - Signed by Freeman Caldron, PA-C on 03/04/2022 ?Histopathologic type: Adenocarcinoma, NOS ?Stage prefix: Initial diagnosis ?Prostate specific antigen (PSA) range: Less than 10 ?Gleason primary pattern: 3 ?Gleason secondary pattern: 4 ?Gleason score: 7 ?Histologic grading system: 5 grade system ?Number of biopsy cores examined: 20 ?Number of biopsy cores positive: 6 ?Location of positive needle core biopsies: Both sides ? ?  ?03/04/2022 Initial Diagnosis  ? Malignant neoplasm of prostate ([place]) ?  ? ?Past Medical History:  ?Diagnosis Date  ? Benign localized prostatic hyperplasia with lower urinary tract symptoms (LUTS)   ? Bladder cancer ([place]) urolgoist-  dr Jeffie Pollock  ? History of adenomatous polyp of colon   ? 2002  tubular adenoma  ? HOH (hard of hearing)   ? Hyperlipidemia   ? Hypothyroidism   ? OAB (overactive bladder)   ? OSA on CPAP   ? severe osa  per study 05/ 2016  ? Wears glasses   ? ? ?Past Surgical History:  ?Procedure Laterality Date  ? APPENDECTOMY    ? CYSTOSCOPY WITH RETROGRADE PYELOGRAM, URETEROSCOPY AND STENT PLACEMENT Right 07/07/2017  ? Procedure: CYSTOSCOPY;  Surgeon: Irine Seal, MD;  Location: [facility];  Service: Urology;  Laterality: Right;  ? CYSTOSCOPY WITH STENT PLACEMENT Right 05/28/2017  ? Procedure: CYSTOSCOPY;  Surgeon: Irine Seal, MD;  Location: [facility];  Service: Urology;  Laterality: Right;  ? KNEE ARTHROSCOPY Right 07/17/2021  ? w mcl repair  ? LUMBAR MICRODISCECTOMY  05-28-2000;  12-09-2002  ? L4--5 (05-28-2000)/  L5--S1 (12-09-2002)  ? RHINOPLASTY    ? TRANSURETHRAL RESECTION OF BLADDER TUMOR N/A 05/28/2017  ? Procedure: TRANSURETHRAL RESECTION OF BLADDER TUMOR (TURBT);  Surgeon: Irine Seal, MD;  Location: [facility];  Service: Urology;  Laterality: N/A;  ? TRANSURETHRAL RESECTION OF BLADDER TUMOR N/A 07/07/2017  ? Procedure: RESTAGING TRANSURETHRAL RESECTION OF BLADDER TUMOR (TURBT);  Surgeon: Irine Seal, MD;  Location[facility];  Service: Urology;  Laterality: N/A;  ? ? ?Social History  ? ?Socioeconomic History  ? Marital status: Married  ?  Spouse name: Not on file  ? Number of children: 1  ? Years of education: BS   ? Highest education level: Not on file  ?Occupational History  ? Occupation: Retired  ?Tobacco Use  ? Smoking status: Former  ?  Types: Cigarettes  ?  Quit date: 05/20/1997  ?  Years since quitting: 24.8  ? Smokeless tobacco: Never  ? Tobacco comments:  ?  Quit in 1980' s  ?Vaping Use  ? Vaping Use: Never used  ?Substance and Sexual Activity  ? Alcohol use: Yes  ?  Alcohol/week: 3.0 standard drinks  ?  Types: 3 Cans of beer per week  ? Drug use: No  ? Sexual activity: Not on file  ?Other Topics Concern  ? Not on file  ?Social History Narrative  ? Daily consumes coffee, tea, and some soda   ? ?Social Determinants of Health  ? ?Financial  Resource Strain: Not on file  ?Food Insecurity: Not on file  ?Transportation Needs: Not on file  ?Physical Activity: Not on file  ?Stress: Not on file  ?Social Connections: Not on file  ?  ? ?FAMILY HISTORY:  ?We obtained a detailed, 4-generation family history.  Significant diagnoses are listed below: ?Family History  ?Problem Relation Age of Onset  ? Colon cancer Mother 56  ? Thyroid cancer Mother   ?     dx. 62s  ? Colon cancer Father 78  ? Lymphoma Brother   ? Cancer Maternal Aunt   ?     unspecified intestinal cancer  ? Throat cancer Maternal Uncle   ? Stomach cancer Maternal Grandmother   ?     dx. 84s  ? ? ? ? ? ?[name] brother was diagnosed with indolent lymphoma and died at age 48. His mother had a history of thyroid cancer diagnosed in her 30s and colon cancer diagnosed at age 90, she died at age 71. He has a maternal uncle with a history of throat cancer, he smoked and is deceased. He has a maternal aunt with a history of an unspecified type of intestinal cancer, she is deceased. His maternal grandmother was diagnosed with bladder cancer in her 50s and died at age 36. [name] father was diagnosed with colon cancer at age 51, he died at age 28. ? ?[name] is unaware of previous family history of genetic testing for hereditary cancer risks.  ? ?GENETIC COUNSELING ASSESSMENT: [name] is a 71 y.o. male with a personal and family history of cancer which is somewhat suggestive of a hereditary predisposition to cancer. We, therefore, discussed and recommended the following at today's visit.  ? ?DISCUSSION: We discussed that 5 - 10% of cancer is hereditary, with most cases of prostate cancer associated with BRCA1/2 and HOXB13.  There are other genes that can be associated with hereditary prostate cancer syndromes.  We discussed that testing is beneficial for several reasons including knowing how to follow individuals after completing their treatment, identifying whether potential treatment options  would be beneficial, and understanding if other family members could be at risk for cancer and allowing them to undergo genetic testing.  ? ?We reviewed the characteristics, features and inheritance patterns of hereditary cancer syndromes. We also discussed genetic testing, including the appropriate family members to test, the process of testing, insurance coverage and turn-around-time for results. We discussed the implications of a negative, positive, carrier and/or variant of uncertain significant result. We recommended [name] pursue genetic testing for a panel that includes genes associated with prostate, bladder, stomach, and colon cancer.  ? ?[name] elected to have [facility] Panel+RNA. The CancerNext gene panel offered by [employer] includes sequencing, rearrangement analysis, and RNA analysis for the following 36 genes:   APC, ATM, AXIN2, BARD1, BMPR1A, BRCA1, BRCA2, BRIP1,  CDH1, CDK4, CDKN2A, CHEK2, DICER1, HOXB13, EPCAM, GREM1, MLH1, MSH2, MSH3, MSH6, MUTYH, NBN, NF1, NTHL1, PALB2, PMS2, POLD1, POLE, PTEN, RAD51C, RAD51D, RECQL, SMAD4, SMARCA4, STK11, and TP53.  ? ?Based on [name] personal and family history of cancer, he meets medical criteria for genetic testing. Despite that he meets criteria, he may still have an out of pocket cost. We discussed that if his out of pocket cost for testing is over $100, the laboratory will call and confirm whether he wants to proceed with testing.  If the out of pocket cost of testing is less than $100 he will be billed by the genetic testing laboratory.  ? ?PLAN: After considering the risks, benefits, and limitations, [name] provided informed consent to pursue genetic testing and the blood sample was sent to [facility] for analysis of the [place]. Results should be available within approximately 2-3 weeks' time, at which point they will be disclosed by telephone to [name], as will any additional recommendations warranted by these  results. [name] will receive a summary of his genetic counseling visit and a copy of his results once available. This information will also be available in Epic.  ? ?[name]'s questions were answered to his

## 2022-03-09 DIAGNOSIS — Z191 Hormone sensitive malignancy status: Secondary | ICD-10-CM | POA: Diagnosis not present

## 2022-03-09 DIAGNOSIS — C61 Malignant neoplasm of prostate: Secondary | ICD-10-CM | POA: Diagnosis not present

## 2022-03-14 ENCOUNTER — Telehealth: Payer: Self-pay | Admitting: Genetic Counselor

## 2022-03-14 ENCOUNTER — Encounter: Payer: Self-pay | Admitting: Genetic Counselor

## 2022-03-14 DIAGNOSIS — Z1379 Encounter for other screening for genetic and chromosomal anomalies: Secondary | ICD-10-CM | POA: Insufficient documentation

## 2022-03-14 NOTE — Telephone Encounter (Signed)
I contacted Mr. Blunck to discuss his genetic testing results. No pathogenic variants were identified in the 36 genes analyzed. Detailed clinic note to follow. ? ?The test report has been scanned into EPIC and is located under the Molecular Pathology section of the Results Review tab.  A portion of the result report is included below for reference.  ? ?[name], MS, [place] ?Genetic Counselor ?[name] ?(P) [phone] ? ? ?

## 2022-03-14 NOTE — Telephone Encounter (Signed)
I attempted to contact [name] to discuss his genetic testing results (36 genes). I left a voicemail requesting he call me back at [phone]. ? ?[name], MS, LCGC ?Genetic Counselor ?[name] ?(P) [phone] ? ?

## 2022-03-17 ENCOUNTER — Ambulatory Visit: Payer: Self-pay | Admitting: Genetic Counselor

## 2022-03-17 DIAGNOSIS — Z1379 Encounter for other screening for genetic and chromosomal anomalies: Secondary | ICD-10-CM

## 2022-03-17 NOTE — Progress Notes (Signed)
HPI:   ?Mr. Simkins was previously seen in the [facility] clinic due to a personal and family history of cancer and concerns regarding a hereditary predisposition to cancer. Please refer to our prior cancer genetics clinic note for more information regarding our discussion, assessment and recommendations, at the time. Mr. Dowson recent genetic test results were disclosed to him, as were recommendations warranted by these results. These results and recommendations are discussed in more detail below. ? ?CANCER HISTORY:  ?Oncology History  ?Malignant neoplasm of prostate ([place])  ?01/10/2022 Cancer Staging  ? Staging form: Prostate, AJCC 8th Edition ?- Clinical stage from 01/10/2022: Stage IIB (cT1c, cN0, cM0, PSA: 5, Grade Group: 2) - Signed by Freeman Caldron, PA-C on 03/04/2022 ?Histopathologic type: Adenocarcinoma, NOS ?Stage prefix: Initial diagnosis ?Prostate specific antigen (PSA) range: Less than 10 ?Gleason primary pattern: 3 ?Gleason secondary pattern: 4 ?Gleason score: 7 ?Histologic grading system: 5 grade system ?Number of biopsy cores examined: 20 ?Number of biopsy cores positive: 6 ?Location of positive needle core biopsies: Both sides ? ?  ?03/04/2022 Initial Diagnosis  ? Malignant neoplasm of prostate ([place]) ?  ? Genetic Testing  ? [facility] CancerNext Panel was Negative. Report date is 03/13/2022. ? ?The CancerNext gene panel offered by [employer] includes sequencing, rearrangement analysis, and RNA analysis for the following 36 genes:   APC, ATM, AXIN2, BARD1, BMPR1A, BRCA1, BRCA2, BRIP1, CDH1, CDK4, CDKN2A, CHEK2, DICER1, HOXB13, EPCAM, GREM1, MLH1, MSH2, MSH3, MSH6, MUTYH, NBN, NF1, NTHL1, PALB2, PMS2, POLD1, POLE, PTEN, RAD51C, RAD51D, RECQL, SMAD4, SMARCA4, STK11, and TP53.  ?  ? ? ?FAMILY HISTORY:  ?We obtained a detailed, 4-generation family history.  Significant diagnoses are listed below: ?     ?Family History  ?Problem Relation Age of Onset  ? Colon cancer Mother 33  ? Thyroid cancer  Mother    ?      dx. 5s  ? Colon cancer Father 82  ? Lymphoma Brother    ? Cancer Maternal Aunt    ?      unspecified intestinal cancer  ? Throat cancer Maternal Uncle    ? Stomach cancer Maternal Grandmother    ?      dx. 52s  ?  ?  ?  ?  ?Mr. Haye's brother was diagnosed with indolent lymphoma and died at age 36. His mother had a history of thyroid cancer diagnosed in her 48s and colon cancer diagnosed at age 37, she died at age 43. He has a maternal uncle with a history of throat cancer, he smoked and is deceased. He has a maternal aunt with a history of an unspecified type of intestinal cancer, she is deceased. His maternal grandmother was diagnosed with bladder cancer in her 36s and died at age 39. Mr. Hollenbeck father was diagnosed with colon cancer at age 3, he died at age 24. ?  ?Mr. Scurlock is unaware of previous family history of genetic testing for hereditary cancer risks.  ? ?GENETIC TEST RESULTS:  ?The [facility] CancerNext Panel found no pathogenic mutations. ? ?The CancerNext gene panel offered by [employer] includes sequencing, rearrangement analysis, and RNA analysis for the following 36 genes:   APC, ATM, AXIN2, BARD1, BMPR1A, BRCA1, BRCA2, BRIP1, CDH1, CDK4, CDKN2A, CHEK2, DICER1, HOXB13, EPCAM, GREM1, MLH1, MSH2, MSH3, MSH6, MUTYH, NBN, NF1, NTHL1, PALB2, PMS2, POLD1, POLE, PTEN, RAD51C, RAD51D, RECQL, SMAD4, SMARCA4, STK11, and TP53.    ? ?The test report has been scanned into EPIC and is located under the  Molecular Pathology section of the Results Review tab.  A portion of the result report is included below for reference. Genetic testing reported out on 03/13/2022.  ? ? ? ? ? ? ? ?Even though a pathogenic variant was not identified, possible explanations for the cancer in the family may include: ?There may be no hereditary risk for cancer in the family. The cancers in Mr. Pridgeon and/or his family may be due to other genetic or environmental factors. ?There may be a gene mutation in one of these  genes that current testing methods cannot detect, but that chance is small. ?There could be another gene that has not yet been discovered, or that we have not yet tested, that is responsible for the cancer diagnoses in the family.  ?It is also possible there is a hereditary cause for the cancer in the family that Mr. Saxer did not inherit. ? ?Therefore, it is important to remain in touch with cancer genetics in the future so that we can continue to offer Mr. Leavitt the most up to date genetic testing.   ? ?ADDITIONAL GENETIC TESTING:  ?We discussed with Mr. Trageser that his genetic testing was fairly extensive.  If there are genes identified to increase cancer risk that can be analyzed in the future, we would be happy to discuss and coordinate this testing at that time.   ? ?CANCER SCREENING RECOMMENDATIONS:  ?Mr. Salce's test result is considered negative (normal).  This means that we have not identified a hereditary cause for his personal and family history of cancer at this time.   ? ?An individual's cancer risk and medical management are not determined by genetic test results alone. Overall cancer risk assessment incorporates additional factors, including personal medical history, family history, and any available genetic information that may result in a personalized plan for cancer prevention and surveillance. Therefore, it is recommended he continue to follow the cancer management and screening guidelines provided by his oncology and primary healthcare provider. ? ?RECOMMENDATIONS FOR FAMILY MEMBERS:   ?Since he did not inherit a mutation in a cancer predisposition gene included on this panel, his son could not have inherited a mutation from him in one of these genes. ? ?FOLLOW-UP:  ?Cancer genetics is a rapidly advancing field and it is possible that new genetic tests will be appropriate for him and/or his family members in the future. We encouraged him to remain in contact with cancer genetics on an annual  basis so we can update his personal and family histories and let him know of advances in cancer genetics that may benefit this family.  ? ?Our contact number was provided. Mr. Senteno questions were answered to his satisfaction, and he knows he is welcome to call us at anytime with additional questions or concerns.  ? ?[name], MS, [place] ?Genetic Counselor ?[name] ?(P) [phone] ? ? ?

## 2022-03-18 DIAGNOSIS — R739 Hyperglycemia, unspecified: Secondary | ICD-10-CM | POA: Diagnosis not present

## 2022-03-18 DIAGNOSIS — E559 Vitamin D deficiency, unspecified: Secondary | ICD-10-CM | POA: Diagnosis not present

## 2022-03-18 DIAGNOSIS — I10 Essential (primary) hypertension: Secondary | ICD-10-CM | POA: Diagnosis not present

## 2022-03-18 DIAGNOSIS — E039 Hypothyroidism, unspecified: Secondary | ICD-10-CM | POA: Diagnosis not present

## 2022-03-18 DIAGNOSIS — E785 Hyperlipidemia, unspecified: Secondary | ICD-10-CM | POA: Diagnosis not present

## 2022-03-18 DIAGNOSIS — Z125 Encounter for screening for malignant neoplasm of prostate: Secondary | ICD-10-CM | POA: Diagnosis not present

## 2022-03-19 ENCOUNTER — Encounter: Payer: Self-pay | Admitting: Genetic Counselor

## 2022-03-20 ENCOUNTER — Telehealth: Payer: Self-pay | Admitting: *Deleted

## 2022-03-20 NOTE — Progress Notes (Signed)
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  ? ?Pt has decided to proceed with brachytherapy as his treatment option.   ? ?MD's and [place] Derry notified, awaiting appointment at this time.  Pt aware.   ?

## 2022-03-20 NOTE — Telephone Encounter (Signed)
Called patient to ask questions, spoke with patient 

## 2022-03-21 ENCOUNTER — Other Ambulatory Visit: Payer: Self-pay | Admitting: Urology

## 2022-03-24 NOTE — Progress Notes (Signed)
Pt reviewed if need ekg prior to surgery scheduled 06-06-2022 by Dr Jeffie Pollock '@[facility]  for radiative prostate seed implants.  Yes , pt does need ekg and appointment made 05-01-2022 @ 1130 after his pre-seed appt at cancer center. ?

## 2022-03-25 ENCOUNTER — Other Ambulatory Visit (HOSPITAL_COMMUNITY): Payer: Medicare Other

## 2022-03-25 ENCOUNTER — Telehealth: Payer: Self-pay | Admitting: *Deleted

## 2022-03-25 DIAGNOSIS — R82998 Other abnormal findings in urine: Secondary | ICD-10-CM | POA: Diagnosis not present

## 2022-03-25 DIAGNOSIS — I2510 Atherosclerotic heart disease of native coronary artery without angina pectoris: Secondary | ICD-10-CM | POA: Diagnosis not present

## 2022-03-25 DIAGNOSIS — Z1212 Encounter for screening for malignant neoplasm of rectum: Secondary | ICD-10-CM | POA: Diagnosis not present

## 2022-03-25 DIAGNOSIS — E039 Hypothyroidism, unspecified: Secondary | ICD-10-CM | POA: Diagnosis not present

## 2022-03-25 DIAGNOSIS — R5383 Other fatigue: Secondary | ICD-10-CM | POA: Diagnosis not present

## 2022-03-25 DIAGNOSIS — Z1331 Encounter for screening for depression: Secondary | ICD-10-CM | POA: Diagnosis not present

## 2022-03-25 DIAGNOSIS — F900 Attention-deficit hyperactivity disorder, predominantly inattentive type: Secondary | ICD-10-CM | POA: Diagnosis not present

## 2022-03-25 DIAGNOSIS — E669 Obesity, unspecified: Secondary | ICD-10-CM | POA: Diagnosis not present

## 2022-03-25 DIAGNOSIS — Z0000 Encounter for general adult medical examination without abnormal findings: Secondary | ICD-10-CM | POA: Diagnosis not present

## 2022-03-25 DIAGNOSIS — R739 Hyperglycemia, unspecified: Secondary | ICD-10-CM | POA: Diagnosis not present

## 2022-03-25 DIAGNOSIS — Z1389 Encounter for screening for other disorder: Secondary | ICD-10-CM | POA: Diagnosis not present

## 2022-03-25 DIAGNOSIS — E785 Hyperlipidemia, unspecified: Secondary | ICD-10-CM | POA: Diagnosis not present

## 2022-03-25 DIAGNOSIS — I10 Essential (primary) hypertension: Secondary | ICD-10-CM | POA: Diagnosis not present

## 2022-03-25 DIAGNOSIS — C61 Malignant neoplasm of prostate: Secondary | ICD-10-CM | POA: Diagnosis not present

## 2022-03-25 DIAGNOSIS — R972 Elevated prostate specific antigen [PSA]: Secondary | ICD-10-CM | POA: Diagnosis not present

## 2022-03-25 DIAGNOSIS — E559 Vitamin D deficiency, unspecified: Secondary | ICD-10-CM | POA: Diagnosis not present

## 2022-03-25 NOTE — Telephone Encounter (Signed)
CALLED PATIENT TO INFORM OF PRE-SEED APPTS. FOR 05-01-22 AND HIS IMPLANT ON 06-06-22, SPOKE WITH PATIENT AND HE IS AWARE OF THESE APPTS. ?

## 2022-04-11 DIAGNOSIS — Z20822 Contact with and (suspected) exposure to covid-19: Secondary | ICD-10-CM | POA: Diagnosis not present

## 2022-04-14 DIAGNOSIS — Z20822 Contact with and (suspected) exposure to covid-19: Secondary | ICD-10-CM | POA: Diagnosis not present

## 2022-04-29 ENCOUNTER — Telehealth: Payer: Self-pay | Admitting: *Deleted

## 2022-04-29 NOTE — Telephone Encounter (Signed)
CALLED PATIENT TO REMIND OF PRE-SEED APPTS. FOR 05-02-22- ARRIVAL TIME- 8:45 AM @ [facility], LVM FOR A RETURN CALL

## 2022-05-01 ENCOUNTER — Ambulatory Visit: Payer: Medicare Other | Admitting: Radiation Oncology

## 2022-05-01 ENCOUNTER — Ambulatory Visit: Payer: Self-pay | Admitting: Urology

## 2022-05-01 ENCOUNTER — Encounter (HOSPITAL_COMMUNITY): Admission: RE | Admit: 2022-05-01 | Payer: Medicare Other | Source: Ambulatory Visit

## 2022-05-01 DIAGNOSIS — Z01818 Encounter for other preprocedural examination: Secondary | ICD-10-CM

## 2022-05-01 NOTE — Progress Notes (Signed)
  Radiation Oncology         [phone] ________________________________  Name: [name] MRN: [id number]  Date: 05/02/2022  DOB: 1951/11/13  SIMULATION AND TREATMENT PLANNING NOTE PUBIC ARCH STUDY  JK:DTOIZ, Jenny Reichmann, MD  Irine Seal, MD  DIAGNOSIS:  71 y.o. gentleman with stage T1c adenocarcinoma of the prostate with a Gleason's score of 3+4 and a PSA of 5  Oncology History  Malignant neoplasm of prostate ([place])  01/10/2022 Cancer Staging   Staging form: Prostate, AJCC 8th Edition - Clinical stage from 01/10/2022: Stage IIB (cT1c, cN0, cM0, PSA: 5, Grade Group: 2) - Signed by Freeman Caldron, PA-C on 03/04/2022 Histopathologic type: Adenocarcinoma, NOS Stage prefix: Initial diagnosis Prostate specific antigen (PSA) range: Less than 10 Gleason primary pattern: 3 Gleason secondary pattern: 4 Gleason score: 7 Histologic grading system: 5 grade system Number of biopsy cores examined: 20 Number of biopsy cores positive: 6 Location of positive needle core biopsies: Both sides    03/04/2022 Initial Diagnosis   Malignant neoplasm of prostate ([place])     Genetic Testing   [facility] CancerNext Panel was Negative. Report date is 03/13/2022.  The CancerNext gene panel offered by [employer] includes sequencing, rearrangement analysis, and RNA analysis for the following 36 genes:   APC, ATM, AXIN2, BARD1, BMPR1A, BRCA1, BRCA2, BRIP1, CDH1, CDK4, CDKN2A, CHEK2, DICER1, HOXB13, EPCAM, GREM1, MLH1, MSH2, MSH3, MSH6, MUTYH, NBN, NF1, NTHL1, PALB2, PMS2, POLD1, POLE, PTEN, RAD51C, RAD51D, RECQL, SMAD4, SMARCA4, STK11, and TP53.        ICD-10-CM   1. Malignant neoplasm of prostate ([place])  C61       COMPLEX SIMULATION:  The patient presented today for evaluation for possible prostate seed implant. He was brought to the radiation planning suite and placed supine on the CT couch. A 3-dimensional image study set was obtained in upload to the planning computer. There, on each axial slice, I contoured  the prostate gland. Then, using three-dimensional radiation planning tools I reconstructed the prostate in view of the structures from the transperineal needle pathway to assess for possible pubic arch interference. In doing so, I did not appreciate any pubic arch interference. Also, the patient's prostate volume was estimated based on the drawn structure. The volume was 48 cc.  Given the pubic arch appearance and prostate volume, patient remains a good candidate to proceed with prostate seed implant. Today, he freely provided informed written consent to proceed.    PLAN: The patient will undergo prostate seed implant to 145 Gy.   ________________________________  Sheral Apley. Tammi Klippel, M.D.

## 2022-05-02 ENCOUNTER — Ambulatory Visit
Admission: RE | Admit: 2022-05-02 | Discharge: 2022-05-02 | Disposition: A | Discharge status: Home / Self Care | Payer: Medicare Other | Source: Ambulatory Visit | Attending: Urology | Admitting: Urology

## 2022-05-02 ENCOUNTER — Ambulatory Visit
Admission: RE | Admit: 2022-05-02 | Discharge: 2022-05-02 | Disposition: A | Discharge status: Home / Self Care | Payer: Medicare Other | Source: Ambulatory Visit | Attending: Radiation Oncology | Admitting: Radiation Oncology

## 2022-05-02 ENCOUNTER — Encounter (HOSPITAL_COMMUNITY)
Admission: RE | Admit: 2022-05-02 | Discharge: 2022-05-02 | Disposition: A | Discharge status: Home / Self Care | Payer: Medicare Other | Source: Ambulatory Visit | Attending: Urology | Admitting: Urology

## 2022-05-02 ENCOUNTER — Encounter: Payer: Self-pay | Admitting: Urology

## 2022-05-02 ENCOUNTER — Other Ambulatory Visit: Payer: Self-pay

## 2022-05-02 VITALS — Resp 20 | Ht 70.0 in | Wt 236.0 lb

## 2022-05-02 DIAGNOSIS — Z191 Hormone sensitive malignancy status: Secondary | ICD-10-CM | POA: Diagnosis not present

## 2022-05-02 DIAGNOSIS — Z01818 Encounter for other preprocedural examination: Secondary | ICD-10-CM

## 2022-05-02 DIAGNOSIS — Z01810 Encounter for preprocedural cardiovascular examination: Secondary | ICD-10-CM | POA: Insufficient documentation

## 2022-05-02 DIAGNOSIS — C61 Malignant neoplasm of prostate: Secondary | ICD-10-CM | POA: Insufficient documentation

## 2022-05-02 NOTE — Progress Notes (Signed)
Pre-seed appointment. I verified patient's identity and began nursing interview. Patient states "He is doing well." No issues reported at this time.  Meaningful use complete. No urinary management medications. Urology appointment- June, 2023  Resp 20   Ht '5\' 10"'$  (1.778 m)   Wt 236 lb (107 kg)   BMI 33.86 kg/m

## 2022-05-20 DIAGNOSIS — C61 Malignant neoplasm of prostate: Secondary | ICD-10-CM | POA: Diagnosis not present

## 2022-05-30 ENCOUNTER — Other Ambulatory Visit: Payer: Self-pay

## 2022-05-30 ENCOUNTER — Encounter (HOSPITAL_BASED_OUTPATIENT_CLINIC_OR_DEPARTMENT_OTHER): Payer: Self-pay | Admitting: Urology

## 2022-06-05 ENCOUNTER — Telehealth: Payer: Self-pay | Admitting: *Deleted

## 2022-06-05 NOTE — H&P (Signed)
Subjective:   [name] is a 71 yo male with T1c NO Mx GG2 prostate cancer and a prior history of bladder cancer who presents today for brachytherapy and SpaceOar for management.  His prostate was 51m at biopsy and his IPSS is 9.  He has some frequency and nocturia.  ROS:  Review of Systems  All other systems reviewed and are negative.   No Known Allergies  Past Medical History:  Diagnosis Date   ADHD (attention deficit hyperactivity disorder)    stopped taking concerta 3 weeks ago due to shortage, seems to be doing ok off of it.05/30/2022   Anxiety    Benign localized prostatic hyperplasia with lower urinary tract symptoms (LUTS)    Bladder cancer ([facility]-  dr wJeffie Pollock  COVID    had it 2 yrs ago body aches and nasal congestion for 1 week all s/s resolved 05/30/2022   History of adenomatous polyp of colon    2002  tubular adenoma   HOH (hard of hearing)    Hyperlipidemia    Hypertension    Hypothyroidism    OAB (overactive bladder)    OSA on CPAP    severe osa per study 05/ 2016   Wears glasses     Past Surgical History:  Procedure Laterality Date   APPENDECTOMY     CYSTOSCOPY WITH RETROGRADE PYELOGRAM, URETEROSCOPY AND STENT PLACEMENT Right 07/07/2017   Procedure: CYSTOSCOPY;  Surgeon: WIrine Seal MD;  [facility]  Service: Urology;  Laterality: Right;   CYSTOSCOPY WITH STENT PLACEMENT Right 05/28/2017   Procedure: CYSTOSCOPY;  Surgeon: WIrine Seal MD;  [facility]  Service: Urology;  Laterality: Right;   KNEE ARTHROSCOPY Right 07/17/2021   w mcl repair   LUMBAR MICRODISCECTOMY  05-28-2000;  12-09-2002   L4--5 (05-28-2000)/  L5--S1 (12-09-2002)   RHINOPLASTY     TOTAL HIP ARTHROPLASTY Right 07/17/2021   TRANSURETHRAL RESECTION OF BLADDER TUMOR N/A 05/28/2017   Procedure: TRANSURETHRAL RESECTION OF BLADDER TUMOR (TURBT);  Surgeon: WIrine Seal MD;  [facility]  Service: Urology;   Laterality: N/A;   TRANSURETHRAL RESECTION OF BLADDER TUMOR N/A 07/07/2017   Procedure: RESTAGING TRANSURETHRAL RESECTION OF BLADDER TUMOR (TURBT);  Surgeon: WIrine Seal MD;  [facility]  Service: Urology;  Laterality: N/A;    Social History   Socioeconomic History   Marital status: Married    Spouse name: Not on file   Number of children: 1   Years of education: BS    Highest education level: Not on file  Occupational History   Occupation: Retired  Tobacco Use   Smoking status: Former    Types: Cigarettes    Quit date: 05/20/1997    Years since quitting: 25.0   Smokeless tobacco: Never   Tobacco comments:    Quit in 1980' s  Vaping Use   Vaping Use: Never used  Substance and Sexual Activity   Alcohol use: Yes    Alcohol/week: 2.0 standard drinks of alcohol    Types: 2 Cans of beer per week   Drug use: No   Sexual activity: Not on file  Other Topics Concern   Not on file  Social History Narrative   Daily consumes coffee, tea, and some soda    Social Determinants of Health   Financial Resource Strain: Not on file  Food Insecurity: Not on file  Transportation Needs: Not on file  Physical Activity: Not on file  Stress: Not on  file  Social Connections: Not on file  Intimate Partner Violence: Not on file    Family History  Problem Relation Age of Onset   Colon cancer Mother 57   Heart murmur Mother    Thyroid cancer Mother        dx. 61s   Colon cancer Father 62   Diabetes Father    Lymphoma Brother    Cancer Maternal Aunt        unspecified intestinal cancer   Throat cancer Maternal Uncle    Stomach cancer Maternal Grandmother        dx. 40s    Anti-infectives: Anti-infectives (From admission, onward)    None       No current facility-administered medications for this encounter.   Current Outpatient Medications  Medication Sig Dispense Refill   Multiple Vitamin (MULTIVITAMIN) capsule Take 1 capsule by mouth daily.  Stopped taking that on 05/26/2022     vitamin B-12 (CYANOCOBALAMIN) 100 MCG tablet Take 100 mcg by mouth daily. Stopped taking that on 05/26/2022     aspirin 81 MG tablet Take 81 mg by mouth daily. Stopped that on 05/25/2022     ATORVASTATIN CALCIUM PO Take 40 mg by mouth every morning.      cholecalciferol (VITAMIN D) 1000 UNITS tablet Take 2,000 Units by mouth daily. Stopped on 05/26/2022     escitalopram (LEXAPRO) 20 MG tablet Take 10 mg by mouth every morning. Pt taking 1/2 tab daily     Levothyroxine Sodium (SYNTHROID PO) Take 137 mcg by mouth every morning.      lisinopril-hydrochlorothiazide (PRINZIDE,ZESTORETIC) 20-12.5 MG per tablet Take 1 tablet by mouth every morning.      methylphenidate (CONCERTA) 54 MG CR tablet Take 54 mg by mouth every morning. Stopped 3 weeks 05/30/2022       Objective: Vital signs in last 24 hours: Ht '5\' 10"'$  (1.778 m)   Wt 108 kg   BMI 34.15 kg/m   Intake/Output from previous day: No intake/output data recorded. Intake/Output this shift: No intake/output data recorded.   Physical Exam Vitals reviewed.  Constitutional:      Appearance: Normal appearance.  Cardiovascular:     Rate and Rhythm: Normal rate and regular rhythm.     Heart sounds: Normal heart sounds.  Pulmonary:     Effort: Pulmonary effort is normal. No respiratory distress.     Breath sounds: Normal breath sounds.  Neurological:     Mental Status: He is alert.     Lab Results:  No results found for this or any previous visit (from the past 24 hour(s)).  BMET No results for input(s): "NA", "K", "CL", "CO2", "GLUCOSE", "BUN", "CREATININE", "CALCIUM" in the last 72 hours. PT/INR No results for input(s): "LABPROT", "INR" in the last 72 hours. ABG No results for input(s): "PHART", "HCO3" in the last 72 hours.  Invalid input(s): "PCO2", "PO2"  Studies/Results: No results found.   Assessment/Plan: Prostate cancer.   He will proceed with the seed implant and SpaceOar.    No orders of the defined types were placed in this encounter.    No orders of the defined types were placed in this encounter.    No follow-ups on file.      Irine Seal 06/05/2022 [phone]

## 2022-06-05 NOTE — Anesthesia Preprocedure Evaluation (Addendum)
Anesthesia Evaluation  Patient identified by MRN, date of birth, ID band Patient awake    Reviewed: Allergy & Precautions, NPO status , Patient's Chart, lab work & pertinent test results  History of Anesthesia Complications Negative for: history of anesthetic complications  Airway Mallampati: II  TM Distance: >3 FB Neck ROM: Full    Dental  (+) Dental Advisory Given, Teeth Intact   Pulmonary sleep apnea and Continuous Positive Airway Pressure Ventilation , former smoker,    Pulmonary exam normal        Cardiovascular hypertension, Pt. on medications Normal cardiovascular exam     Neuro/Psych PSYCHIATRIC DISORDERS Anxiety  Hard of hearing     GI/Hepatic negative GI ROS, Neg liver ROS,   Endo/Other  Hypothyroidism  Obesity   Renal/GU negative Renal ROS Bladder dysfunction    Prostate cancer     Musculoskeletal negative musculoskeletal ROS (+)   Abdominal   Peds  (+) ADHD Hematology negative hematology ROS (+)   Anesthesia Other Findings   Reproductive/Obstetrics                            Anesthesia Physical Anesthesia Plan  ASA: 3  Anesthesia Plan: General   Post-op Pain Management: Tylenol PO (pre-op)*   Induction: Intravenous  PONV Risk Score and Plan: 2 and Treatment may vary due to age or medical condition, Ondansetron and Dexamethasone  Airway Management Planned: LMA  Additional Equipment: None  Intra-op Plan:   Post-operative Plan: Extubation in OR  Informed Consent: I have reviewed the patients History and Physical, chart, labs and discussed the procedure including the risks, benefits and alternatives for the proposed anesthesia with the patient or authorized representative who has indicated his/her understanding and acceptance.     Dental advisory given  Plan Discussed with: CRNA and Anesthesiologist  Anesthesia Plan Comments:        Anesthesia  Quick Evaluation

## 2022-06-05 NOTE — Telephone Encounter (Signed)
CALLED PATIENT TO REMIND OF PROCEDURE FOR 06-06-22, SPOKE WITH PATIENT AND HE IS AWARE OF THIS PROCEDURE

## 2022-06-06 ENCOUNTER — Other Ambulatory Visit: Payer: Self-pay

## 2022-06-06 ENCOUNTER — Encounter (HOSPITAL_BASED_OUTPATIENT_CLINIC_OR_DEPARTMENT_OTHER)
Admission: RE | Disposition: A | Discharge status: Home / Self Care | Payer: Self-pay | Source: Ambulatory Visit | Attending: Urology

## 2022-06-06 ENCOUNTER — Ambulatory Visit (HOSPITAL_BASED_OUTPATIENT_CLINIC_OR_DEPARTMENT_OTHER): Payer: Medicare Other | Admitting: Anesthesiology

## 2022-06-06 ENCOUNTER — Ambulatory Visit (HOSPITAL_BASED_OUTPATIENT_CLINIC_OR_DEPARTMENT_OTHER)
Admission: RE | Admit: 2022-06-06 | Discharge: 2022-06-06 | Disposition: A | Discharge status: Home / Self Care | Payer: Medicare Other | Source: Ambulatory Visit | Attending: Urology | Admitting: Urology

## 2022-06-06 ENCOUNTER — Ambulatory Visit (HOSPITAL_COMMUNITY): Payer: Medicare Other

## 2022-06-06 ENCOUNTER — Encounter (HOSPITAL_BASED_OUTPATIENT_CLINIC_OR_DEPARTMENT_OTHER): Payer: Self-pay | Admitting: Urology

## 2022-06-06 DIAGNOSIS — E669 Obesity, unspecified: Secondary | ICD-10-CM | POA: Diagnosis not present

## 2022-06-06 DIAGNOSIS — G4733 Obstructive sleep apnea (adult) (pediatric): Secondary | ICD-10-CM

## 2022-06-06 DIAGNOSIS — Z6834 Body mass index (BMI) 34.0-34.9, adult: Secondary | ICD-10-CM | POA: Diagnosis not present

## 2022-06-06 DIAGNOSIS — F909 Attention-deficit hyperactivity disorder, unspecified type: Secondary | ICD-10-CM | POA: Insufficient documentation

## 2022-06-06 DIAGNOSIS — E039 Hypothyroidism, unspecified: Secondary | ICD-10-CM | POA: Diagnosis not present

## 2022-06-06 DIAGNOSIS — I10 Essential (primary) hypertension: Secondary | ICD-10-CM | POA: Insufficient documentation

## 2022-06-06 DIAGNOSIS — Z79899 Other long term (current) drug therapy: Secondary | ICD-10-CM | POA: Insufficient documentation

## 2022-06-06 DIAGNOSIS — Z87891 Personal history of nicotine dependence: Secondary | ICD-10-CM | POA: Insufficient documentation

## 2022-06-06 DIAGNOSIS — F419 Anxiety disorder, unspecified: Secondary | ICD-10-CM | POA: Insufficient documentation

## 2022-06-06 DIAGNOSIS — Z1379 Encounter for other screening for genetic and chromosomal anomalies: Secondary | ICD-10-CM

## 2022-06-06 DIAGNOSIS — C61 Malignant neoplasm of prostate: Secondary | ICD-10-CM | POA: Diagnosis not present

## 2022-06-06 DIAGNOSIS — Z9989 Dependence on other enabling machines and devices: Secondary | ICD-10-CM

## 2022-06-06 DIAGNOSIS — Z191 Hormone sensitive malignancy status: Secondary | ICD-10-CM | POA: Diagnosis not present

## 2022-06-06 DIAGNOSIS — Z8551 Personal history of malignant neoplasm of bladder: Secondary | ICD-10-CM | POA: Insufficient documentation

## 2022-06-06 HISTORY — DX: Attention-deficit hyperactivity disorder, unspecified type: F90.9

## 2022-06-06 HISTORY — DX: Essential (primary) hypertension: I10

## 2022-06-06 HISTORY — DX: Anxiety disorder, unspecified: F41.9

## 2022-06-06 HISTORY — DX: COVID-19: U07.1

## 2022-06-06 HISTORY — PX: RADIOACTIVE SEED IMPLANT: SHX5150

## 2022-06-06 HISTORY — PX: SPACE OAR INSTILLATION: SHX6769

## 2022-06-06 LAB — POCT I-STAT, CHEM 8
BUN: 24 mg/dL — ABNORMAL HIGH (ref 8–23)
Calcium, Ion: 1.26 mmol/L (ref 1.15–1.40)
Chloride: 105 mmol/L (ref 98–111)
Creatinine, Ser: 1.3 mg/dL — ABNORMAL HIGH (ref 0.61–1.24)
Glucose, Bld: 109 mg/dL — ABNORMAL HIGH (ref 70–99)
HCT: 43 % (ref 39.0–52.0)
Hemoglobin: 14.6 g/dL (ref 13.0–17.0)
Potassium: 4.1 mmol/L (ref 3.5–5.1)
Sodium: 142 mmol/L (ref 135–145)
TCO2: 25 mmol/L (ref 22–32)

## 2022-06-06 SURGERY — INSERTION, RADIATION SOURCE, PROSTATE
Anesthesia: General

## 2022-06-06 MED ORDER — PROPOFOL 10 MG/ML IV BOLUS
INTRAVENOUS | Status: AC
  Filled 2022-06-06: qty 20

## 2022-06-06 MED ORDER — LIDOCAINE HCL (PF) 2 % IJ SOLN
INTRAMUSCULAR | Status: AC
  Filled 2022-06-06: qty 5

## 2022-06-06 MED ORDER — HYDROCODONE-ACETAMINOPHEN 5-325 MG PO TABS: 2.0000 | ORAL_TABLET | Freq: Four times a day (QID) | ORAL | 0 refills | Status: DC | PRN

## 2022-06-06 MED ORDER — LACTATED RINGERS IV SOLN: INTRAVENOUS | Status: DC

## 2022-06-06 MED ORDER — CIPROFLOXACIN IN D5W 400 MG/200ML IV SOLN
400.0000 mg | INTRAVENOUS | Status: AC
  Administered 2022-06-06: 400 mg via INTRAVENOUS

## 2022-06-06 MED ORDER — CIPROFLOXACIN IN D5W 400 MG/200ML IV SOLN
INTRAVENOUS | Status: AC
  Filled 2022-06-06: qty 200

## 2022-06-06 MED ORDER — PROPOFOL 10 MG/ML IV BOLUS
INTRAVENOUS | Status: DC | PRN
  Administered 2022-06-06: 160 mg via INTRAVENOUS

## 2022-06-06 MED ORDER — ROCURONIUM BROMIDE 100 MG/10ML IV SOLN
INTRAVENOUS | Status: DC | PRN
  Administered 2022-06-06: 10 mg via INTRAVENOUS
  Administered 2022-06-06: 50 mg via INTRAVENOUS

## 2022-06-06 MED ORDER — DEXAMETHASONE SODIUM PHOSPHATE 4 MG/ML IJ SOLN
INTRAMUSCULAR | Status: DC | PRN
  Administered 2022-06-06: 10 mg via INTRAVENOUS

## 2022-06-06 MED ORDER — FENTANYL CITRATE (PF) 100 MCG/2ML IJ SOLN
INTRAMUSCULAR | Status: DC | PRN
  Administered 2022-06-06 (×5): 50 ug via INTRAVENOUS

## 2022-06-06 MED ORDER — LIDOCAINE HCL (CARDIAC) PF 100 MG/5ML IV SOSY
PREFILLED_SYRINGE | INTRAVENOUS | Status: DC | PRN
  Administered 2022-06-06: 60 mg via INTRAVENOUS

## 2022-06-06 MED ORDER — ACETAMINOPHEN 500 MG PO TABS
1000.0000 mg | ORAL_TABLET | Freq: Once | ORAL | Status: AC
Start: 2022-06-06 — End: 2022-06-06
  Administered 2022-06-06: 1000 mg via ORAL

## 2022-06-06 MED ORDER — SODIUM CHLORIDE (PF) 0.9 % IJ SOLN
INTRAMUSCULAR | Status: DC | PRN
  Administered 2022-06-06: 10 mL

## 2022-06-06 MED ORDER — ROCURONIUM BROMIDE 10 MG/ML (PF) SYRINGE
PREFILLED_SYRINGE | INTRAVENOUS | Status: AC
  Filled 2022-06-06: qty 10

## 2022-06-06 MED ORDER — ACETAMINOPHEN 500 MG PO TABS
ORAL_TABLET | ORAL | Status: AC
  Filled 2022-06-06: qty 2

## 2022-06-06 MED ORDER — ONDANSETRON HCL 4 MG/2ML IJ SOLN
INTRAMUSCULAR | Status: DC | PRN
  Administered 2022-06-06: 4 mg via INTRAVENOUS

## 2022-06-06 MED ORDER — ONDANSETRON HCL 4 MG/2ML IJ SOLN
INTRAMUSCULAR | Status: AC
  Filled 2022-06-06: qty 2

## 2022-06-06 MED ORDER — SODIUM CHLORIDE 0.9% FLUSH: 3.0000 mL | Freq: Two times a day (BID) | INTRAVENOUS | Status: DC

## 2022-06-06 MED ORDER — SUGAMMADEX SODIUM 200 MG/2ML IV SOLN
INTRAVENOUS | Status: DC | PRN
  Administered 2022-06-06: 200 mg via INTRAVENOUS

## 2022-06-06 MED ORDER — SODIUM CHLORIDE 0.9 % IV SOLN
INTRAVENOUS | Status: AC | PRN
  Administered 2022-06-06: 1000 mL

## 2022-06-06 MED ORDER — OXYCODONE HCL 5 MG PO TABS: 5.0000 mg | ORAL_TABLET | Freq: Once | ORAL | Status: DC | PRN

## 2022-06-06 MED ORDER — FENTANYL CITRATE (PF) 250 MCG/5ML IJ SOLN
INTRAMUSCULAR | Status: AC
  Filled 2022-06-06: qty 5

## 2022-06-06 MED ORDER — ONDANSETRON HCL 4 MG/2ML IJ SOLN
4.0000 mg | Freq: Once | INTRAMUSCULAR | Status: DC | PRN
Start: 2022-06-06 — End: 2022-06-06

## 2022-06-06 MED ORDER — FLEET ENEMA 7-19 GM/118ML RE ENEM: 1.0000 | ENEMA | Freq: Once | RECTAL | Status: DC

## 2022-06-06 MED ORDER — FENTANYL CITRATE (PF) 100 MCG/2ML IJ SOLN: 25.0000 ug | INTRAMUSCULAR | Status: DC | PRN

## 2022-06-06 MED ORDER — IOHEXOL 300 MG/ML  SOLN
INTRAMUSCULAR | Status: DC | PRN
  Administered 2022-06-06: 10 mL

## 2022-06-06 MED ORDER — OXYCODONE HCL 5 MG/5ML PO SOLN: 5.0000 mg | Freq: Once | ORAL | Status: DC | PRN

## 2022-06-06 MED ORDER — DEXAMETHASONE SODIUM PHOSPHATE 10 MG/ML IJ SOLN
INTRAMUSCULAR | Status: AC
  Filled 2022-06-06: qty 1

## 2022-06-06 SURGICAL SUPPLY — 49 items
BAG DRN RND TRDRP ANRFLXCHMBR (UROLOGICAL SUPPLIES) ×1
BAG URINE DRAIN 2000ML AR STRL (UROLOGICAL SUPPLIES) ×3 IMPLANT
BLADE CLIPPER SENSICLIP SURGIC (BLADE) ×3 IMPLANT
CATH FOLEY 2WAY SLVR  5CC 16FR (CATHETERS) ×2
CATH FOLEY 2WAY SLVR 5CC 16FR (CATHETERS) ×2 IMPLANT
CATH ROBINSON RED A/P 16FR (CATHETERS) IMPLANT
CATH ROBINSON RED A/P 20FR (CATHETERS) ×3 IMPLANT
CLOTH BEACON ORANGE TIMEOUT ST (SAFETY) ×3 IMPLANT
CNTNR URN SCR LID CUP LEK RST (MISCELLANEOUS) ×2 IMPLANT
CONT SPEC 4OZ STRL OR WHT (MISCELLANEOUS) ×2
COVER BACK TABLE 60X90IN (DRAPES) ×3 IMPLANT
COVER MAYO STAND STRL (DRAPES) ×3 IMPLANT
DRSG TEGADERM 4X4.75 (GAUZE/BANDAGES/DRESSINGS) ×3 IMPLANT
DRSG TEGADERM 8X12 (GAUZE/BANDAGES/DRESSINGS) ×3 IMPLANT
GAUZE SPONGE 4X4 12PLY STRL (GAUZE/BANDAGES/DRESSINGS) ×1 IMPLANT
GEL ULTRASOUND 20GR AQUASONIC (MISCELLANEOUS) ×3 IMPLANT
GLOVE BIO SURGEON STRL SZ 6.5 (GLOVE) ×3 IMPLANT
GLOVE BIO SURGEON STRL SZ7.5 (GLOVE) IMPLANT
GLOVE BIO SURGEON STRL SZ8 (GLOVE) IMPLANT
GLOVE BIOGEL PI IND STRL 6.5 (GLOVE) IMPLANT
GLOVE BIOGEL PI INDICATOR 6.5 (GLOVE)
GLOVE SURG ORTHO 8.5 STRL (GLOVE) ×3 IMPLANT
GLOVE SURG SS PI 6.5 STRL IVOR (GLOVE) IMPLANT
GLOVE SURG SS PI 8.0 STRL IVOR (GLOVE) ×6 IMPLANT
GOWN STRL REUS W/TWL LRG LVL3 (GOWN DISPOSABLE) ×3 IMPLANT
GRID BRACH TEMP 18GA 2.8X3X.75 (MISCELLANEOUS) ×3 IMPLANT
HOLDER FOLEY CATH W/STRAP (MISCELLANEOUS) ×3 IMPLANT
IMPL SPACEOAR VUE SYSTEM (Spacer) ×2 IMPLANT
IMPLANT SPACEOAR VUE SYSTEM (Spacer) ×2 IMPLANT
IV NS 1000ML (IV SOLUTION) ×2
IV NS 1000ML BAXH (IV SOLUTION) ×2 IMPLANT
KIT TURNOVER CYSTO (KITS) ×3 IMPLANT
NDL BRACHY 18G 5PK (NEEDLE) ×8 IMPLANT
NDL BRACHY 18G SINGLE (NEEDLE) IMPLANT
NDL PK MORGANSTERN STABILIZ (NEEDLE) ×2 IMPLANT
NEEDLE BRACHY 18G 5PK (NEEDLE) ×8 IMPLANT
NEEDLE BRACHY 18G SINGLE (NEEDLE) IMPLANT
NEEDLE PK MORGANSTERN STABILIZ (NEEDLE) ×2 IMPLANT
PACK CYSTO (CUSTOM PROCEDURE TRAY) ×3 IMPLANT
QUICK LINK SEEDS ×1 IMPLANT
SHEATH ULTRASOUND LF (SHEATH) IMPLANT
SHEATH ULTRASOUND LTX NONSTRL (SHEATH) IMPLANT
SUT BONE WAX W31G (SUTURE) IMPLANT
SYR 10ML LL (SYRINGE) IMPLANT
SYR CONTROL 10ML LL (SYRINGE) ×3 IMPLANT
TOWEL OR 17X26 10 PK STRL BLUE (TOWEL DISPOSABLE) ×3 IMPLANT
UNDERPAD 30X36 HEAVY ABSORB (UNDERPADS AND DIAPERS) ×6 IMPLANT
WATER STERILE IRR 3000ML UROMA (IV SOLUTION) ×3 IMPLANT
WATER STERILE IRR 500ML POUR (IV SOLUTION) ×3 IMPLANT

## 2022-06-06 NOTE — Transfer of Care (Signed)
Immediate Anesthesia Transfer of Care Note  Patient: [name]  Procedure(s) Performed: Procedure(s) (LRB): RADIOACTIVE SEED IMPLANT/BRACHYTHERAPY IMPLANT (N/A) SPACE OAR INSTILLATION (N/A)  Patient Location: PACU  Anesthesia Type: General  Level of Consciousness: awake, sedated, patient cooperative and responds to stimulation  Airway & Oxygen Therapy: Patient Spontanous Breathing and Patient connected to [place] 02   Post-op Assessment: Report given to PACU RN, Post -op Vital signs reviewed and stable and Patient moving all extremities  Post vital signs: Reviewed and stable  Complications: No apparent anesthesia complications

## 2022-06-06 NOTE — Anesthesia Postprocedure Evaluation (Signed)
Anesthesia Post Note  Patient: [name]  Procedure(s) Performed: RADIOACTIVE SEED IMPLANT/BRACHYTHERAPY IMPLANT SPACE OAR INSTILLATION     Patient location during evaluation: PACU Anesthesia Type: General Level of consciousness: awake and alert Pain management: pain level controlled Vital Signs Assessment: post-procedure vital signs reviewed and stable Respiratory status: spontaneous breathing, nonlabored ventilation and respiratory function stable Cardiovascular status: stable and blood pressure returned to baseline Anesthetic complications: no   No notable events documented.  Last Vitals:  Vitals:   06/06/22 0915 06/06/22 0955  BP: 128/83 (!) 146/83  Pulse: 69 65  Resp: 12 18  Temp:  36.6 C  SpO2: 94% 96%    Last Pain:  Vitals:   06/06/22 0955  TempSrc:   PainSc: 0-No pain                 Audry Pili

## 2022-06-06 NOTE — Op Note (Signed)
PATIENT:  [name]  PRE-OPERATIVE DIAGNOSIS:  Adenocarcinoma of the prostate  POST-OPERATIVE DIAGNOSIS:  Same  PROCEDURE:  Procedure(s): 1. I-125 radioactive seed implantation 2. SpaceOAR implantation. 3.  Cystoscopy  SURGEON:  Surgeon(s): Irine Seal MD  Radiation oncologist: Dr. Tyler Pita  ANESTHESIA:  General  EBL:  Minimal  DRAINS: 48 French Foley catheter  INDICATION: [name] is a 71 y.o. with Stage T1c, Gleason 7(3+4) prostate cancer who has elected brachytherapy for treatment.  Description of procedure: After informed consent the patient was brought to the major OR, placed on the table and administered general anesthesia. He was then moved to the modified lithotomy position with his perineum perpendicular to the floor. His perineum and genitalia were then sterilely prepped. An official timeout was then performed. A 16 French Foley catheter was then placed in the bladder and filled with dilute contrast, a rectal tube was placed in the rectum and the transrectal ultrasound probe was placed in the rectum and affixed to the stand. He was then sterilely draped.  The sterile grid was installed.   Anchor needles were then placed.   Real time ultrasonography was used along with the seed planning software spot-pro version 3.1-00. This was used to develop the seed plan including the number of needles as well as number of seeds required for complete and adequate coverage. Real-time ultrasonography was then used along with the previously developed plan  to implant a total of 64 seeds using 19 needles for a target dose of 145 Gy. This proceeded without difficulty or complication.  The anchor needles and guide were removed and the SpaceOAR needle was passed under US guidance into the fat stripe posterior to the prostate with the tip in the midline at mid prostate. A puff of NS confirmed appropriate positioning and the SpaceOAR Vue polymer was then injected over 12 seconds into  the space with excellent distribution.     A Foley catheter was then removed as well as the transrectal ultrasound probe and rectal probe. Flexible cystoscopy was then performed using the 17 French flexible scope which revealed a normal urethra throughout its length down to the sphincter which appeared intact. The prostatic urethra was 2cm with bilobar hyperplasia with mild coaptation. The bladder was then entered and fully and systematically inspected.  There was mild trabeculation. The ureteral orifices were noted to be of normal configuration and position. The mucosa revealed no evidence of tumors. There were also no stones identified within the bladder.  No seeds or spacers were seen and/or removed from the bladder.  The cystoscope was then removed.  The drapes were removed.  The perineum was cleaned and dressed.  He was taken out of the lithotomy position and was awakened and taken to recovery room in stable and satisfactory condition. He tolerated procedure well and there were no intraoperative complications.

## 2022-06-06 NOTE — Discharge Instructions (Signed)
NO TYLENOL PRODUCTS UNTIL 12:00 PM TODAY.     Post Anesthesia Home Care Instructions  Activity: Get plenty of rest for the remainder of the day. A responsible individual must stay with you for 24 hours following the procedure.  For the next 24 hours, DO NOT: -Drive a car -Paediatric nurse -Drink alcoholic beverages -Take any medication unless instructed by your physician -Make any legal decisions or sign important papers.  Meals: Start with liquid foods such as gelatin or soup. Progress to regular foods as tolerated. Avoid greasy, spicy, heavy foods. If nausea and/or vomiting occur, drink only clear liquids until the nausea and/or vomiting subsides. Call your physician if vomiting continues.  Special Instructions/Symptoms: Your throat may feel dry or sore from the anesthesia or the breathing tube placed in your throat during surgery. If this causes discomfort, gargle with warm salt water. The discomfort should disappear within 24 hours.

## 2022-06-06 NOTE — Interval H&P Note (Signed)
History and Physical Interval Note:  06/06/2022 7:06 AM  [name]  has presented today for surgery, with the diagnosis of PROSTATE CANCER.  The various methods of treatment have been discussed with the patient and family. After consideration of risks, benefits and other options for treatment, the patient has consented to  Procedure(s): RADIOACTIVE SEED IMPLANT/BRACHYTHERAPY IMPLANT (N/A) SPACE OAR INSTILLATION (N/A) as a surgical intervention.  The patient's history has been reviewed, patient examined, no change in status, stable for surgery.  I have reviewed the patient's chart and labs.  Questions were answered to the patient's satisfaction.     Irine Seal

## 2022-06-06 NOTE — Anesthesia Procedure Notes (Signed)
Procedure Name: Intubation Date/Time: 06/06/2022 7:38 AM  Performed by: Justice Rocher, CRNAPre-anesthesia Checklist: Patient identified, Emergency Drugs available, Suction available, Patient being monitored and Timeout performed Patient Re-evaluated:Patient Re-evaluated prior to induction Oxygen Delivery Method: Circle system utilized Preoxygenation: Pre-oxygenation with 100% oxygen Induction Type: IV induction Ventilation: Mask ventilation without difficulty Laryngoscope Size: Mac and 4 Grade View: Grade II Tube type: Oral Tube size: 7.5 mm Number of attempts: 1 Airway Equipment and Method: Stylet and Oral airway Placement Confirmation: ETT inserted through vocal cords under direct vision, positive ETCO2, breath sounds checked- equal and bilateral and CO2 detector Secured at: 23 cm Tube secured with: Tape Dental Injury: Teeth and Oropharynx as per pre-operative assessment

## 2022-06-07 NOTE — Progress Notes (Signed)
Radiation Oncology         [phone] ________________________________  Name: [name] MRN: [id number]  Date: 06/07/2022  DOB: 06/20/1951       Prostate Seed Implant  UY:QIHKV, Jenny Reichmann, MD  Irine Seal, MD  DIAGNOSIS:  71 y.o. gentleman with stage T1c adenocarcinoma of the prostate with a Gleason's score of 3+4 and a PSA of 5  Oncology History  Malignant neoplasm of prostate ([place])  01/10/2022 Cancer Staging   Staging form: Prostate, AJCC 8th Edition - Clinical stage from 01/10/2022: Stage IIB (cT1c, cN0, cM0, PSA: 5, Grade Group: 2) - Signed by Freeman Caldron, PA-C on 03/04/2022 Histopathologic type: Adenocarcinoma, NOS Stage prefix: Initial diagnosis Prostate specific antigen (PSA) range: Less than 10 Gleason primary pattern: 3 Gleason secondary pattern: 4 Gleason score: 7 Histologic grading system: 5 grade system Number of biopsy cores examined: 20 Number of biopsy cores positive: 6 Location of positive needle core biopsies: Both sides   03/04/2022 Initial Diagnosis   Malignant neoplasm of prostate ([place])    Genetic Testing   [facility] CancerNext Panel was Negative. Report date is 03/13/2022.  The CancerNext gene panel offered by [employer] includes sequencing, rearrangement analysis, and RNA analysis for the following 36 genes:   APC, ATM, AXIN2, BARD1, BMPR1A, BRCA1, BRCA2, BRIP1, CDH1, CDK4, CDKN2A, CHEK2, DICER1, HOXB13, EPCAM, GREM1, MLH1, MSH2, MSH3, MSH6, MUTYH, NBN, NF1, NTHL1, PALB2, PMS2, POLD1, POLE, PTEN, RAD51C, RAD51D, RECQL, SMAD4, SMARCA4, STK11, and TP53.        ICD-10-CM   1. Genetic testing  Z13.79     2. Malignant neoplasm of prostate ([place])  C61     3. Primary hypertension  I10 CANCELED: I-Stat, Chem 8 on day of surgery per protocol    CANCELED: I-Stat, Chem 8 on day of surgery per protocol      PROCEDURE: Insertion of radioactive I-125 seeds into the prostate gland.  RADIATION DOSE: 145 Gy, definitive therapy.  TECHNIQUE: DAMICO PARTIN was  brought to the operating room with the urologist. He was placed in the dorsolithotomy position. He was catheterized and a rectal tube was inserted. The perineum was shaved, prepped and draped. The ultrasound probe was then introduced into the rectum to see the prostate gland.  TREATMENT DEVICE: A needle grid was attached to the ultrasound probe stand and anchor needles were placed.  3D PLANNING: The prostate was imaged in 3D using a sagittal sweep of the prostate probe. These images were transferred to the planning computer. There, the prostate, urethra and rectum were defined on each axial reconstructed image. Then, the software created an optimized 3D plan and a few seed positions were adjusted. The quality of the plan was reviewed using [facility] information for the target and the following two organs at risk:  Urethra and Rectum.  Then the accepted plan was printed and handed off to the radiation therapist.  Under my supervision, the custom loading of the seeds and spacers was carried out and loaded into sealed vicryl sleeves.  These pre-loaded needles were then placed into the needle holder.Marland Kitchen  PROSTATE VOLUME STUDY:  Using transrectal ultrasound the volume of the prostate was verified to be 49.6 cc.  SPECIAL TREATMENT PROCEDURE/SUPERVISION AND HANDLING: The pre-loaded needles were then delivered under sagittal guidance. A total of 19 needles were used to deposit 64 seeds in the prostate gland. The individual seed activity was 0.506 mCi.  SpaceOAR:  Yes  COMPLEX SIMULATION: At the end of the procedure, an anterior radiograph of the  pelvis was obtained to document seed positioning and count. Cystoscopy was performed to check the urethra and bladder.  MICRODOSIMETRY: At the end of the procedure, the patient was emitting 0.082 mR/hr at 1 meter. Accordingly, he was considered safe for hospital discharge.  PLAN: The patient will return to the radiation oncology clinic for post implant CT dosimetry in three  weeks.   ________________________________  Sheral Apley Tammi Klippel, M.D.

## 2022-06-11 ENCOUNTER — Encounter (HOSPITAL_BASED_OUTPATIENT_CLINIC_OR_DEPARTMENT_OTHER): Payer: Self-pay | Admitting: Urology

## 2022-06-17 NOTE — Progress Notes (Signed)
RN received call from patient regarding dysuria and polyuria.  Denies any fevers.   Patient is s/p brachytherapy from 06/06/2022.    RN provided education on side effects of brachytherapy involving increase in urinate and burning with urination post procedure.  RN encouraged patient to contact [facility] to review with Dr. Jeffie Pollock for possible alpha blocker therapy/recommendations.  Pt verbalized understanding and agreement.

## 2022-06-24 ENCOUNTER — Telehealth: Payer: Self-pay | Admitting: *Deleted

## 2022-06-24 NOTE — Telephone Encounter (Signed)
CALLED PATIENT TO REMIND OF POST SEED APPTS. FOR 06-26-22, SPOKE WITH PATIENT AND HE IS AWARE OF THESE APPTS.

## 2022-06-25 NOTE — Progress Notes (Signed)
  Radiation Oncology         [phone] ________________________________  Name: [name] MRN: [id number]  Date: 06/26/2022  DOB: [birth year]  COMPLEX SIMULATION NOTE  NARRATIVE:  The patient was brought to the [facility] suite today following prostate seed implantation approximately one month ago.  Identity was confirmed.  All relevant records and images related to the planned course of therapy were reviewed.  Then, the patient was set-up supine.  CT images were obtained.  The CT images were loaded into the planning software.  Then the prostate and rectum were contoured.  Treatment planning then occurred.  The implanted iodine 125 seeds were identified by the physics staff for projection of radiation distribution  I have requested : 3D Simulation  I have requested a DVH of the following structures: Prostate and rectum.    ________________________________  Sheral Apley Tammi Klippel, M.D.

## 2022-06-25 NOTE — Progress Notes (Signed)
Radiation Oncology         [phone] ________________________________  Name: [name] MRN: [id number]  Date: 06/26/2022  DOB: 08/09/1951  Post-Seed Follow-Up Visit Note  CC: Shon Baton, MD  Irine Seal, MD  Diagnosis:   71 y.o. gentleman with stage T1c adenocarcinoma of the prostate with a Gleason's score of 3+4 and a PSA of 5    ICD-10-CM   1. Malignant neoplasm of prostate ([place])  C61       Interval Since Last Radiation:  3 weeks 06/06/22:  Insertion of radioactive I-125 seeds into the prostate gland; 145 Gy, definitive therapy with placement of SpaceOAR gel.  Narrative:  The patient returns today for routine follow-up.  He is complaining of increased urinary frequency and urinary hesitation symptoms. He filled out a questionnaire regarding urinary function today providing and overall IPSS score of 15 characterizing his symptoms as moderate with nocturia x3, weak flow of stream, hesitancy, urgency and incomplete bladder emptying.  He did recently start taking Flomax and reports significant improvement in his LUTS with this medication.  His pre-implant score was 9.  He specifically denies dysuria, gross hematuria, straining to void or incontinence.  He reports a healthy appetite and is maintaining his weight.  He denies any abdominal pain or bowel symptoms.  His energy level is gradually improving and overall, he is quite pleased with his progress to date.  ALLERGIES:  has no allergies on file.  Meds: Current Outpatient Medications  Medication Sig Dispense Refill   aspirin 81 MG tablet Take 81 mg by mouth daily. Stopped that on 05/25/2022     ATORVASTATIN CALCIUM PO Take 40 mg by mouth every morning.      cholecalciferol (VITAMIN D) 1000 UNITS tablet Take 2,000 Units by mouth daily. Stopped on 05/26/2022     escitalopram (LEXAPRO) 20 MG tablet Take 10 mg by mouth every morning. Pt taking 1/2 tab daily     HYDROcodone-acetaminophen (NORCO/VICODIN) 5-325 MG tablet Take 2  tablets by mouth every 6 (six) hours as needed for moderate pain. 8 tablet 0   Levothyroxine Sodium (SYNTHROID PO) Take 137 mcg by mouth every morning.      lisinopril-hydrochlorothiazide (PRINZIDE,ZESTORETIC) 20-12.5 MG per tablet Take 1 tablet by mouth every morning.      methylphenidate (CONCERTA) 54 MG CR tablet Take 54 mg by mouth every morning. Stopped 3 weeks 05/30/2022     Multiple Vitamin (MULTIVITAMIN) capsule Take 1 capsule by mouth daily. Stopped taking that on 05/26/2022     vitamin B-12 (CYANOCOBALAMIN) 100 MCG tablet Take 100 mcg by mouth daily. Stopped taking that on 05/26/2022     No current facility-administered medications for this visit.    Physical Findings: In general this is a well appearing Caucasian male in no acute distress. He's alert and oriented x4 and appropriate throughout the examination. Cardiopulmonary assessment is negative for acute distress and he exhibits normal effort.   Lab Findings: Lab Results  Component Value Date   HGB 14.6 06/06/2022   HCT 43.0 06/06/2022    Radiographic Findings:  Patient underwent CT imaging in our clinic for post implant dosimetry. The CT will be reviewed by Dr. Tammi Klippel to confirm there is an adequate distribution of radioactive seeds throughout the prostate gland and ensure that there are no seeds in or near the rectum.  We suspect the final radiation plan and dosimetry will show appropriate coverage of the prostate gland. He understands that we will call and inform him of any  unexpected findings on further review of his imaging and dosimetry.  Impression/Plan: The patient is recovering from the effects of radiation. His urinary symptoms should gradually improve over the next 4-6 months. We talked about this today. He is encouraged by his improvement already and is otherwise pleased with his outcome. We also talked about long-term follow-up for prostate cancer following seed implant. He understands that ongoing PSA  determinations and digital rectal exams will help perform surveillance to rule out disease recurrence. He has a follow up appointment scheduled at [facility] urology on 06/27/2022. He understands what to expect with his PSA measures. Patient was also educated today about some of the long-term effects from radiation including a small risk for rectal bleeding and possibly erectile dysfunction. We talked about some of the general management approaches to these potential complications. However, I did encourage the patient to contact our office or return at any point if he has questions or concerns related to his previous radiation and prostate cancer.    Nicholos Johns, PA-C

## 2022-06-26 ENCOUNTER — Encounter: Payer: Self-pay | Admitting: Urology

## 2022-06-26 ENCOUNTER — Ambulatory Visit
Admission: RE | Admit: 2022-06-26 | Discharge: 2022-06-26 | Disposition: A | Discharge status: Home / Self Care | Payer: Medicare Other | Source: Ambulatory Visit | Attending: Radiation Oncology | Admitting: Radiation Oncology

## 2022-06-26 ENCOUNTER — Other Ambulatory Visit: Payer: Self-pay

## 2022-06-26 ENCOUNTER — Ambulatory Visit
Admission: RE | Admit: 2022-06-26 | Discharge: 2022-06-26 | Disposition: A | Discharge status: Home / Self Care | Payer: Medicare Other | Source: Ambulatory Visit | Attending: Urology | Admitting: Urology

## 2022-06-26 VITALS — BP 134/75 | HR 101 | Temp 97.3°F | Resp 20 | Ht 70.0 in | Wt 245.0 lb

## 2022-06-26 DIAGNOSIS — C61 Malignant neoplasm of prostate: Secondary | ICD-10-CM | POA: Insufficient documentation

## 2022-06-26 DIAGNOSIS — Z7982 Long term (current) use of aspirin: Secondary | ICD-10-CM | POA: Insufficient documentation

## 2022-06-26 DIAGNOSIS — Z923 Personal history of irradiation: Secondary | ICD-10-CM | POA: Insufficient documentation

## 2022-06-26 DIAGNOSIS — Z79890 Hormone replacement therapy (postmenopausal): Secondary | ICD-10-CM | POA: Insufficient documentation

## 2022-06-26 DIAGNOSIS — Z79899 Other long term (current) drug therapy: Secondary | ICD-10-CM | POA: Insufficient documentation

## 2022-06-26 NOTE — Progress Notes (Signed)
Post-Seed appointment. I verified patient's identity and began nursing interview. Patient reports urinary discomfort that has greatly improved w/ the use of Flomax. No prostate discomfort reported at this time.  Meaningful use complete. I-PSS score of 15-moderate. Flomax as directed. Urology appt- 06/27/2022  BP 134/75   Pulse (!) 101   Temp (!) 97.3 F (36.3 C)   Resp 20   Ht '5\' 10"'$  (1.778 m)   Wt 245 lb (111.1 kg)   SpO2 95%   BMI 35.15 kg/m

## 2022-06-27 DIAGNOSIS — C61 Malignant neoplasm of prostate: Secondary | ICD-10-CM | POA: Diagnosis not present

## 2022-07-11 DIAGNOSIS — H2513 Age-related nuclear cataract, bilateral: Secondary | ICD-10-CM | POA: Diagnosis not present

## 2022-07-11 DIAGNOSIS — H524 Presbyopia: Secondary | ICD-10-CM | POA: Diagnosis not present

## 2022-07-11 DIAGNOSIS — H52203 Unspecified astigmatism, bilateral: Secondary | ICD-10-CM | POA: Diagnosis not present

## 2022-07-17 ENCOUNTER — Ambulatory Visit
Admission: RE | Admit: 2022-07-17 | Discharge: 2022-07-17 | Disposition: A | Discharge status: Home / Self Care | Payer: Medicare Other | Source: Ambulatory Visit | Attending: Radiation Oncology | Admitting: Radiation Oncology

## 2022-07-17 ENCOUNTER — Encounter: Payer: Self-pay | Admitting: Radiation Oncology

## 2022-07-17 DIAGNOSIS — C61 Malignant neoplasm of prostate: Secondary | ICD-10-CM | POA: Diagnosis not present

## 2022-07-17 DIAGNOSIS — Z191 Hormone sensitive malignancy status: Secondary | ICD-10-CM | POA: Diagnosis not present

## 2022-07-23 NOTE — Progress Notes (Signed)
  Radiation Oncology         [phone] ________________________________  Name: [name] MRN: [id number]  Date: 07/17/2022  DOB: 08-07-1951  3D Planning Note   Prostate Brachytherapy Post-Implant Dosimetry  Diagnosis: 71 y.o. gentleman with stage T1c adenocarcinoma of the prostate with a Gleason's score of 3+4 and a PSA of 5  Narrative: On a previous date, [name] returned following prostate seed implantation for post implant planning. He underwent CT scan complex simulation to delineate the three-dimensional structures of the pelvis and demonstrate the radiation distribution.  Since that time, the seed localization, and complex isodose planning with dose volume histograms have now been completed.  Results:   Prostate Coverage - The dose of radiation delivered to the 90% or more of the prostate gland (D90) was 121.78% of the prescription dose. This exceeds our goal of greater than 90%. Rectal Sparing - The volume of rectal tissue receiving the prescription dose or higher was 0.0 cc. This falls under our thresholds tolerance of 1.0 cc.  Impression: The prostate seed implant appears to show adequate target coverage and appropriate rectal sparing.  Plan:  The patient will continue to follow with urology for ongoing PSA determinations. I would anticipate a high likelihood for local tumor control with minimal risk for rectal morbidity.  ________________________________  Sheral Apley Tammi Klippel, M.D.

## 2022-10-01 DIAGNOSIS — C61 Malignant neoplasm of prostate: Secondary | ICD-10-CM | POA: Diagnosis not present

## 2022-10-07 DIAGNOSIS — E785 Hyperlipidemia, unspecified: Secondary | ICD-10-CM | POA: Diagnosis not present

## 2022-10-07 DIAGNOSIS — I2510 Atherosclerotic heart disease of native coronary artery without angina pectoris: Secondary | ICD-10-CM | POA: Diagnosis not present

## 2022-10-07 DIAGNOSIS — E669 Obesity, unspecified: Secondary | ICD-10-CM | POA: Diagnosis not present

## 2022-10-07 DIAGNOSIS — I10 Essential (primary) hypertension: Secondary | ICD-10-CM | POA: Diagnosis not present

## 2022-10-07 DIAGNOSIS — R5383 Other fatigue: Secondary | ICD-10-CM | POA: Diagnosis not present

## 2022-10-07 DIAGNOSIS — C61 Malignant neoplasm of prostate: Secondary | ICD-10-CM | POA: Diagnosis not present

## 2022-10-07 DIAGNOSIS — F419 Anxiety disorder, unspecified: Secondary | ICD-10-CM | POA: Diagnosis not present

## 2022-10-07 DIAGNOSIS — C4492 Squamous cell carcinoma of skin, unspecified: Secondary | ICD-10-CM | POA: Diagnosis not present

## 2022-10-07 DIAGNOSIS — E559 Vitamin D deficiency, unspecified: Secondary | ICD-10-CM | POA: Diagnosis not present

## 2022-10-07 DIAGNOSIS — R739 Hyperglycemia, unspecified: Secondary | ICD-10-CM | POA: Diagnosis not present

## 2022-10-07 DIAGNOSIS — Z23 Encounter for immunization: Secondary | ICD-10-CM | POA: Diagnosis not present

## 2022-10-07 DIAGNOSIS — E039 Hypothyroidism, unspecified: Secondary | ICD-10-CM | POA: Diagnosis not present

## 2022-10-08 DIAGNOSIS — R3915 Urgency of urination: Secondary | ICD-10-CM | POA: Diagnosis not present

## 2022-10-08 DIAGNOSIS — R350 Frequency of micturition: Secondary | ICD-10-CM | POA: Diagnosis not present

## 2022-10-08 DIAGNOSIS — N401 Enlarged prostate with lower urinary tract symptoms: Secondary | ICD-10-CM | POA: Diagnosis not present

## 2022-10-08 DIAGNOSIS — Z8551 Personal history of malignant neoplasm of bladder: Secondary | ICD-10-CM | POA: Diagnosis not present

## 2022-10-24 DIAGNOSIS — Z96651 Presence of right artificial knee joint: Secondary | ICD-10-CM | POA: Diagnosis not present

## 2022-12-09 NOTE — Progress Notes (Unsigned)
Set up date for CPAP 09/23/21   [facility] [address] [place] [address] [phone]       OFFICE FOLLOW UP NOTE  [name] Date of Birth:  11/26/1951 Medical Record Number:  [id number]   Primary neurologist: Dr. Rexene Alberts Reason for visit: CPAP compliance visit    SUBJECTIVE:   CHIEF COMPLAINT:  No chief complaint on file.   HPI:   Update 12/10/2022 JM: Patient returns for yearly CPAP follow-up visit.  Compliance report as below showing excellent usage and optimal residual AHI.         History provided for reference purposes only Update 12/10/2021 JM: patient being seen for initial CPAP compliance visit unaccompanied after receiving a new machine on 09/23/2021. Routinely followed by Dr. Rexene Alberts since 2016. Doing well on new machine. Likes that is is much quieter than his prior machine. Continues use of full face mask. Continues to follow with [facility] and up-to-date on supplies.  Epworth Sleepiness Scale 1. No concerns at this time.     Update 01/31/2021 Dr. Rexene Alberts:  I reviewed his CPAP compliance data from 12/31/2020 through 01/29/2021, which is a total of 30 days, during which time he used his machine every night with percent use days greater than 4 hours at 100%, indicating superb compliance with an average usage of 7 hours and 14 minutes, residual AHI at goal at 2.4/h, leak acceptable on the higher side with a 95th percentile at 17.3 L/min on a pressure of 13 cm with EPR of 3.  He reports doing well.  He is compliant with his CPAP, sometimes the humidifier does not use up all the water and he sees water leftover in the water chamber which is unusual.  He has not changed his settings.  Other than that, he has no problems with the usage of the machine or the machine itself.  He has shaved his beard and noticed an immediate improvement in the air leakage from the mask.  He has had no changes to his medications or medical history.   ROS:    14 system review of systems performed and negative with exception of no complaints  PMH:  Past Medical History:  Diagnosis Date   ADHD (attention deficit hyperactivity disorder)    stopped taking concerta 3 weeks ago due to shortage, seems to be doing ok off of it.05/30/2022   Anxiety    Benign localized prostatic hyperplasia with lower urinary tract symptoms (LUTS)    Bladder cancer ([place]) urolgoist-  dr Jeffie Pollock   COVID    had it 2 yrs ago body aches and nasal congestion for 1 week all s/s resolved 05/30/2022   History of adenomatous polyp of colon    2002  tubular adenoma   HOH (hard of hearing)    Hyperlipidemia    Hypertension    Hypothyroidism    OAB (overactive bladder)    OSA on CPAP    severe osa per study 05/ 2016   Wears glasses     PSH:  Past Surgical History:  Procedure Laterality Date   APPENDECTOMY     CYSTOSCOPY WITH RETROGRADE PYELOGRAM, URETEROSCOPY AND STENT PLACEMENT Right 07/07/2017   Procedure: CYSTOSCOPY;  Surgeon: Irine Seal, MD;  Location: [facility];  Service: Urology;  Laterality: Right;   CYSTOSCOPY WITH STENT PLACEMENT Right 05/28/2017   Procedure: CYSTOSCOPY;  Surgeon: Irine Seal, MD;  Location: [facility];  Service: Urology;  Laterality: Right;   KNEE ARTHROSCOPY  Right 07/17/2021   w mcl repair   LUMBAR MICRODISCECTOMY  05-28-2000;  12-09-2002   L4--5 (05-28-2000)/  L5--S1 (12-09-2002)   RADIOACTIVE SEED IMPLANT N/A 06/06/2022   Procedure: RADIOACTIVE SEED IMPLANT/BRACHYTHERAPY IMPLANT;  Surgeon: Irine Seal, MD;  Location: [facility];  Service: Urology;  Laterality: N/A;   RHINOPLASTY     SPACE OAR INSTILLATION N/A 06/06/2022   Procedure: SPACE OAR INSTILLATION;  Surgeon: Irine Seal, MD;  [facility]  Service: Urology;  Laterality: N/A;   TOTAL HIP ARTHROPLASTY Right 07/17/2021   TRANSURETHRAL RESECTION OF BLADDER TUMOR N/A 05/28/2017   Procedure: TRANSURETHRAL RESECTION  OF BLADDER TUMOR (TURBT);  Surgeon: Irine Seal, MD;  Location: [facility];  Service: Urology;  Laterality: N/A;   TRANSURETHRAL RESECTION OF BLADDER TUMOR N/A 07/07/2017   Procedure: RESTAGING TRANSURETHRAL RESECTION OF BLADDER TUMOR (TURBT);  Surgeon: Irine Seal, MD;  Location: [facility];  Service: Urology;  Laterality: N/A;    Social History:  Social History   Socioeconomic History   Marital status: Married    Spouse name: Not on file   Number of children: 1   Years of education: BS    Highest education level: Not on file  Occupational History   Occupation: Retired  Tobacco Use   Smoking status: Former    Types: Cigarettes    Quit date: 05/20/1997    Years since quitting: 25.5   Smokeless tobacco: Never   Tobacco comments:    Quit in 1980' s  Vaping Use   Vaping Use: Never used  Substance and Sexual Activity   Alcohol use: Yes    Alcohol/week: 2.0 standard drinks of alcohol    Types: 2 Cans of beer per week   Drug use: No   Sexual activity: Not on file  Other Topics Concern   Not on file  Social History Narrative   Daily consumes coffee, tea, and some soda    Social Determinants of Health   Financial Resource Strain: Not on file  Food Insecurity: Not on file  Transportation Needs: Not on file  Physical Activity: Not on file  Stress: Not on file  Social Connections: Not on file  Intimate Partner Violence: Not on file    Family History:  Family History  Problem Relation Age of Onset   Colon cancer Mother 71   Heart murmur Mother    Thyroid cancer Mother        dx. 27s   Colon cancer Father 51   Diabetes Father    Lymphoma Brother    Cancer Maternal Aunt        unspecified intestinal cancer   Throat cancer Maternal Uncle    Stomach cancer Maternal Grandmother        dx. 40s    Medications:   Current Outpatient Medications on File Prior to Visit  Medication Sig Dispense Refill   aspirin 81 MG tablet Take 81 mg by  mouth daily. Stopped that on 05/25/2022     ATORVASTATIN CALCIUM PO Take 40 mg by mouth every morning.      cholecalciferol (VITAMIN D) 1000 UNITS tablet Take 2,000 Units by mouth daily. Stopped on 05/26/2022     escitalopram (LEXAPRO) 20 MG tablet Take 10 mg by mouth every morning. Pt taking 1/2 tab daily     Levothyroxine Sodium (SYNTHROID PO) Take 137 mcg by mouth every morning.      lisinopril-hydrochlorothiazide (PRINZIDE,ZESTORETIC) 20-12.5 MG per tablet Take 1 tablet by mouth  every morning.      Multiple Vitamin (MULTIVITAMIN) capsule Take 1 capsule by mouth daily. Stopped taking that on 05/26/2022     vitamin B-12 (CYANOCOBALAMIN) 100 MCG tablet Take 100 mcg by mouth daily. Stopped taking that on 05/26/2022     No current facility-administered medications on file prior to visit.    Allergies:  Not on File    OBJECTIVE:  Physical Exam  There were no vitals filed for this visit.  There is no height or weight on file to calculate BMI. No results found.  General: well developed, well nourished, very pleasant elderly Caucasian male, seated, in no evident distress Head: head normocephalic and atraumatic.   Neck: supple with no carotid or supraclavicular bruits Cardiovascular: regular rate and rhythm, no murmurs Musculoskeletal: no deformity Skin:  no rash/petichiae Vascular:  Normal pulses all extremities   Neurologic Exam Mental Status: Awake and fully alert. Oriented to place and time. Recent and remote memory intact. Attention span, concentration and fund of knowledge appropriate. Mood and affect appropriate.  Cranial Nerves: Pupils equal, briskly reactive to light. Extraocular movements full without nystagmus. Visual fields full to confrontation. Hearing intact. Facial sensation intact. Face, tongue, palate moves normally and symmetrically.  Motor: Normal bulk and tone. Normal strength in all tested extremity muscles Sensory.: intact to touch , pinprick , position and  vibratory sensation.  Coordination: Rapid alternating movements normal in all extremities. Finger-to-nose and heel-to-shin performed accurately bilaterally. Gait and Station: Arises from chair without difficulty. Stance is normal. Gait demonstrates normal stride length and balance without use of AD.  Reflexes: 1+ and symmetric. Toes downgoing.        ASSESSMENT/PLAN: ELEK HOLDERNESS is a 72 y.o. year old male with known sleep apnea followed by Dr. Rexene Alberts since 2016. Recently received new CPAP machine on 09/23/2021 - here for initial compliance visit.     -Excellent compliance with optimal residual AHI -Continue current settings -Continue follow-up with [facility] for any needed supplies or CPAP related concerns   No orders of the defined types were placed in this encounter.     Follow up in 1 year or call earlier if needed    CC:  PCP: Shon Baton, MD    I spent 23 minutes of face-to-face and non-face-to-face time with patient.  This included previsit chart review, lab review, study review, order entry, electronic health record documentation, patient education and discussion regarding history of sleep apnea, review and discussion of recent compliance report, importance of continue nightly usage and answered all other questions to patient satisfaction   Frann Rider, AGNP-BC  [facility] [address] [place], [place] [address]  Phone [phone] Fax [phone] Note: This document was prepared with digital dictation and possible smart phrase technology. Any transcriptional errors that result from this process are unintentional.

## 2022-12-10 ENCOUNTER — Encounter: Payer: Self-pay | Admitting: Adult Health

## 2022-12-10 ENCOUNTER — Ambulatory Visit (INDEPENDENT_AMBULATORY_CARE_PROVIDER_SITE_OTHER): Payer: Medicare Other | Admitting: Adult Health

## 2022-12-10 VITALS — BP 149/79 | HR 71 | Ht 70.0 in | Wt 249.0 lb

## 2022-12-10 DIAGNOSIS — G4733 Obstructive sleep apnea (adult) (pediatric): Secondary | ICD-10-CM

## 2022-12-10 NOTE — Patient Instructions (Signed)
Continue nightly use of CPAP for adequate sleep apnea management  Continue to follow with your DME company [facility] for any needed supplies or CPAP related concerns    Follow-up in 1 year or call earlier if needed

## 2022-12-15 ENCOUNTER — Telehealth: Payer: Self-pay

## 2023-01-08 DIAGNOSIS — L821 Other seborrheic keratosis: Secondary | ICD-10-CM | POA: Diagnosis not present

## 2023-01-08 DIAGNOSIS — D1801 Hemangioma of skin and subcutaneous tissue: Secondary | ICD-10-CM | POA: Diagnosis not present

## 2023-01-08 DIAGNOSIS — Z85828 Personal history of other malignant neoplasm of skin: Secondary | ICD-10-CM | POA: Diagnosis not present

## 2023-01-08 DIAGNOSIS — L570 Actinic keratosis: Secondary | ICD-10-CM | POA: Diagnosis not present

## 2023-01-08 DIAGNOSIS — L905 Scar conditions and fibrosis of skin: Secondary | ICD-10-CM | POA: Diagnosis not present

## 2023-04-03 DIAGNOSIS — R739 Hyperglycemia, unspecified: Secondary | ICD-10-CM | POA: Diagnosis not present

## 2023-04-03 DIAGNOSIS — K219 Gastro-esophageal reflux disease without esophagitis: Secondary | ICD-10-CM | POA: Diagnosis not present

## 2023-04-03 DIAGNOSIS — R7989 Other specified abnormal findings of blood chemistry: Secondary | ICD-10-CM | POA: Diagnosis not present

## 2023-04-03 DIAGNOSIS — E785 Hyperlipidemia, unspecified: Secondary | ICD-10-CM | POA: Diagnosis not present

## 2023-04-03 DIAGNOSIS — E039 Hypothyroidism, unspecified: Secondary | ICD-10-CM | POA: Diagnosis not present

## 2023-04-03 DIAGNOSIS — Z125 Encounter for screening for malignant neoplasm of prostate: Secondary | ICD-10-CM | POA: Diagnosis not present

## 2023-04-03 DIAGNOSIS — E559 Vitamin D deficiency, unspecified: Secondary | ICD-10-CM | POA: Diagnosis not present

## 2023-04-06 DIAGNOSIS — Z8546 Personal history of malignant neoplasm of prostate: Secondary | ICD-10-CM | POA: Diagnosis not present

## 2023-04-10 DIAGNOSIS — F900 Attention-deficit hyperactivity disorder, predominantly inattentive type: Secondary | ICD-10-CM | POA: Diagnosis not present

## 2023-04-10 DIAGNOSIS — C61 Malignant neoplasm of prostate: Secondary | ICD-10-CM | POA: Diagnosis not present

## 2023-04-10 DIAGNOSIS — D414 Neoplasm of uncertain behavior of bladder: Secondary | ICD-10-CM | POA: Diagnosis not present

## 2023-04-10 DIAGNOSIS — E039 Hypothyroidism, unspecified: Secondary | ICD-10-CM | POA: Diagnosis not present

## 2023-04-10 DIAGNOSIS — I2510 Atherosclerotic heart disease of native coronary artery without angina pectoris: Secondary | ICD-10-CM | POA: Diagnosis not present

## 2023-04-10 DIAGNOSIS — C4492 Squamous cell carcinoma of skin, unspecified: Secondary | ICD-10-CM | POA: Diagnosis not present

## 2023-04-10 DIAGNOSIS — R82998 Other abnormal findings in urine: Secondary | ICD-10-CM | POA: Diagnosis not present

## 2023-04-10 DIAGNOSIS — E785 Hyperlipidemia, unspecified: Secondary | ICD-10-CM | POA: Diagnosis not present

## 2023-04-10 DIAGNOSIS — Z0000 Encounter for general adult medical examination without abnormal findings: Secondary | ICD-10-CM | POA: Diagnosis not present

## 2023-04-10 DIAGNOSIS — E669 Obesity, unspecified: Secondary | ICD-10-CM | POA: Diagnosis not present

## 2023-04-10 DIAGNOSIS — R739 Hyperglycemia, unspecified: Secondary | ICD-10-CM | POA: Diagnosis not present

## 2023-04-10 DIAGNOSIS — Z1331 Encounter for screening for depression: Secondary | ICD-10-CM | POA: Diagnosis not present

## 2023-04-10 DIAGNOSIS — Z1212 Encounter for screening for malignant neoplasm of rectum: Secondary | ICD-10-CM | POA: Diagnosis not present

## 2023-04-10 DIAGNOSIS — Z1389 Encounter for screening for other disorder: Secondary | ICD-10-CM | POA: Diagnosis not present

## 2023-04-10 DIAGNOSIS — I10 Essential (primary) hypertension: Secondary | ICD-10-CM | POA: Diagnosis not present

## 2023-04-10 DIAGNOSIS — G4733 Obstructive sleep apnea (adult) (pediatric): Secondary | ICD-10-CM | POA: Diagnosis not present

## 2023-04-13 DIAGNOSIS — R351 Nocturia: Secondary | ICD-10-CM | POA: Diagnosis not present

## 2023-04-13 DIAGNOSIS — Z8546 Personal history of malignant neoplasm of prostate: Secondary | ICD-10-CM | POA: Diagnosis not present

## 2023-04-13 DIAGNOSIS — Z8551 Personal history of malignant neoplasm of bladder: Secondary | ICD-10-CM | POA: Diagnosis not present

## 2023-04-13 DIAGNOSIS — N401 Enlarged prostate with lower urinary tract symptoms: Secondary | ICD-10-CM | POA: Diagnosis not present

## 2023-04-14 ENCOUNTER — Telehealth: Payer: Self-pay | Admitting: Gastroenterology

## 2023-04-14 NOTE — Telephone Encounter (Signed)
error 

## 2023-04-29 ENCOUNTER — Encounter: Payer: Self-pay | Admitting: Nurse Practitioner

## 2023-05-25 DIAGNOSIS — M25562 Pain in left knee: Secondary | ICD-10-CM | POA: Diagnosis not present

## 2023-07-06 DIAGNOSIS — M25562 Pain in left knee: Secondary | ICD-10-CM | POA: Diagnosis not present

## 2023-07-13 ENCOUNTER — Encounter: Payer: Self-pay | Admitting: Nurse Practitioner

## 2023-07-13 ENCOUNTER — Other Ambulatory Visit (INDEPENDENT_AMBULATORY_CARE_PROVIDER_SITE_OTHER): Payer: Medicare Other

## 2023-07-13 ENCOUNTER — Ambulatory Visit (INDEPENDENT_AMBULATORY_CARE_PROVIDER_SITE_OTHER): Payer: Medicare Other | Admitting: Nurse Practitioner

## 2023-07-13 VITALS — BP 124/68 | HR 64 | Ht 70.0 in | Wt 233.4 lb

## 2023-07-13 DIAGNOSIS — R195 Other fecal abnormalities: Secondary | ICD-10-CM | POA: Diagnosis not present

## 2023-07-13 DIAGNOSIS — Z85038 Personal history of other malignant neoplasm of large intestine: Secondary | ICD-10-CM

## 2023-07-13 DIAGNOSIS — Z800 Family history of malignant neoplasm of digestive organs: Secondary | ICD-10-CM

## 2023-07-13 LAB — CBC
HCT: 42.8 % (ref 39.0–52.0)
Hemoglobin: 14.1 g/dL (ref 13.0–17.0)
MCHC: 32.9 g/dL (ref 30.0–36.0)
MCV: 88.2 fl (ref 78.0–100.0)
Platelets: 237 10*3/uL (ref 150.0–400.0)
RBC: 4.85 Mil/uL (ref 4.22–5.81)
RDW: 14.1 % (ref 11.5–15.5)
WBC: 7.3 10*3/uL (ref 4.0–10.5)

## 2023-07-13 MED ORDER — NA SULFATE-K SULFATE-MG SULF 17.5-3.13-1.6 GM/177ML PO SOLN: 1.0000 | Freq: Once | ORAL | 0 refills | Status: AC

## 2023-07-13 NOTE — Patient Instructions (Addendum)
You have been scheduled for a colonoscopy. Please follow written instructions given to you at your visit today.   Please pick up your prep supplies at the pharmacy within the next 1-3 days.  If you use inhalers (even only as needed), please bring them with you on the day of your procedure.  DO NOT TAKE 7 DAYS PRIOR TO TEST- Trulicity (dulaglutide) Ozempic, Wegovy (semaglutide) Mounjaro (tirzepatide) Bydureon Bcise (exanatide extended release)  DO NOT TAKE 1 DAY PRIOR TO YOUR TEST Rybelsus (semaglutide) Adlyxin (lixisenatide) Victoza (liraglutide) Byetta (exanatide) _____________________________________________________  Your provider has requested that you go to the basement level for lab work before leaving today. Press "B" on the elevator. The lab is located at the first door on the left as you exit the elevator.  Due to recent changes in healthcare laws, you may see the results of your imaging and laboratory studies on MyChart before your provider has had a chance to review them.  We understand that in some cases there may be results that are confusing or concerning to you. Not all laboratory results come back in the same time frame and the provider may be waiting for multiple results in order to interpret others.  Please give [place] 48 hours in order for your provider to thoroughly review all the results before contacting the office for clarification of your results.   Thank you for trusting me with your gastrointestinal care!   Alcide Evener, CRNP

## 2023-07-13 NOTE — Progress Notes (Signed)
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  He presents to our office today as referred by Dr. Creola Corn to schedule a colonoscopy.  FOBT was + 03/2023.  He denies having any abdominal pain.  He passes a normal brown formed bowel movement daily.  No rectal bleeding or black stools.  He takes Aleve 2 tabs twice daily for arthritis.  He takes ASA 81 mg daily.  He has undergone numerous colonoscopies in the past by Dr. Kinnie Scales which showed colon polyps.  His most recent colonoscopy he believes was approximately 7 years ago and a few polyps were removed at that time.  His father was diagnosed with colon cancer at the age of 24 and his mother was diagnosed with colon cancer at the age of 61. Prior history of GERD which abated after he retired.  He denies having any recent heartburn.  No dysphagia.    Past Medical History:  Diagnosis Date   ADHD (attention deficit hyperactivity disorder)    stopped taking concerta 3 weeks ago due to shortage, seems to be doing ok off of it.05/30/2022   Anxiety    Benign localized prostatic hyperplasia with lower urinary tract symptoms (LUTS)    Bladder cancer (HCC) urolgoist-  dr Annabell Howells   COVID    had it 2 yrs ago body aches and nasal congestion for 1 week all s/s resolved 05/30/2022   History of adenomatous polyp of colon    2002  tubular adenoma   HOH (hard of hearing)    Hyperlipidemia    Hypertension    Hypothyroidism    OAB (overactive bladder)    OSA on CPAP    severe osa per study 05/ 2016   Wears glasses     Past Surgical History:  Procedure Laterality Date   APPENDECTOMY     CYSTOSCOPY WITH RETROGRADE PYELOGRAM, URETEROSCOPY AND STENT PLACEMENT Right 07/07/2017   Procedure: CYSTOSCOPY;  Surgeon: Bjorn Pippin, MD;  Location: [facility];  Service: Urology;  Laterality: Right;   CYSTOSCOPY WITH STENT PLACEMENT Right 05/28/2017   Procedure: CYSTOSCOPY;  Surgeon: Bjorn Pippin, MD;  Location: [facility];  Service: Urology;  Laterality: Right;   KNEE ARTHROSCOPY Right 07/17/2021   w mcl repair   LUMBAR MICRODISCECTOMY  05-28-2000;  12-09-2002   L4--5 (05-28-2000)/  L5--S1 (12-09-2002)   RADIOACTIVE SEED IMPLANT N/A 06/06/2022   Procedure: RADIOACTIVE SEED IMPLANT/BRACHYTHERAPY IMPLANT;  Surgeon: Bjorn Pippin, MD;  Location: [facility];  Service: Urology;  Laterality: N/A;   RHINOPLASTY     SPACE OAR INSTILLATION N/A 06/06/2022   Procedure: SPACE OAR INSTILLATION;  Surgeon: Bjorn Pippin, MD;  Location: [facility];  Service: Urology;  Laterality: N/A;   TOTAL HIP ARTHROPLASTY Right 07/17/2021   TRANSURETHRAL RESECTION OF BLADDER TUMOR N/A 05/28/2017   Procedure: TRANSURETHRAL RESECTION OF BLADDER TUMOR (TURBT);  Surgeon: Bjorn Pippin, MD;  Location: [facility];  Service: Urology;  Laterality: N/A;   TRANSURETHRAL RESECTION OF BLADDER TUMOR N/A 07/07/2017   Procedure: RESTAGING TRANSURETHRAL RESECTION OF BLADDER TUMOR (TURBT);  Surgeon: Bjorn Pippin, MD;  Location: [facility];  Service:  Urology;  Laterality: N/A;   Social History: He is married.  He has 1 son.  He is retired.  Non-smoker.  He drinks 2-3 alcoholic beverages monthly.  No drug use.  Family History: family history includes Cancer in his maternal aunt; Colon cancer (age of onset: 107) in his father; Colon cancer (age of onset: 93) in his mother; Diabetes in his father; Heart murmur in his mother; Lymphoma in his brother; Stomach cancer in his maternal  grandmother; Throat cancer in his maternal uncle; Thyroid cancer in his mother.   Outpatient Encounter Medications as of 07/13/2023  Medication Sig   aspirin 81 MG tablet Take 81 mg by mouth daily. Stopped that on 05/25/2022   ATORVASTATIN CALCIUM PO Take 40 mg by mouth every morning.    cholecalciferol (VITAMIN D) 1000 UNITS tablet Take 2,000 Units by mouth daily. Stopped on 05/26/2022   escitalopram (LEXAPRO) 20 MG tablet Take 10 mg by mouth every morning. Pt taking 1/2 tab daily   Levothyroxine Sodium (SYNTHROID PO) Take 150 mcg by mouth every morning. On Saturday's takes 1.5 tablets   lisinopril-hydrochlorothiazide (PRINZIDE,ZESTORETIC) 20-12.5 MG per tablet Take 1 tablet by mouth every morning.    Multiple Vitamin (MULTIVITAMIN) capsule Take 1 capsule by mouth daily. Stopped taking that on 05/26/2022   Na Sulfate-K Sulfate-Mg Sulf 17.5-3.13-1.6 GM/177ML SOLN Take 1 kit by mouth once for 1 dose.   Naproxen Sodium (ALEVE) 220 MG CAPS Take 2 capsules by mouth 2 (two) times daily.   OVER THE COUNTER MEDICATION Magnesium glycinate 240mg  : one daily   OVER THE COUNTER MEDICATION Super C with D3 and Zinc 900mg : one daily   silodosin (RAPAFLO) 8 MG CAPS capsule Take 8 mg by mouth daily with breakfast.   vitamin B-12 (CYANOCOBALAMIN) 100 MCG tablet Take 100 mcg by mouth daily. Stopped taking that on 05/26/2022   [DISCONTINUED] divalproex (DEPAKOTE SPRINKLE) 125 MG capsule Take 125 mg by mouth daily.   No facility-administered encounter medications on file as of 07/13/2023.    REVIEW OF SYSTEMS:  Gen: Denies fever, sweats or chills. No weight loss.  CV: Denies chest pain, palpitations or edema. Resp: Denies cough, shortness of breath of hemoptysis.  GI: Denies heartburn, dysphagia, stomach or lower abdominal pain. No diarrhea or constipation.  GU: + Urine leakage. MS:+ Arthritis.  Derm: + Itchiness right arm.  Psych: + history of anxiety and depression. Heme: Denies bruising, easy  bleeding. Neuro:  Denies headaches, dizziness or paresthesias. Endo:  Denies any problems with DM, thyroid or adrenal function.  PHYSICAL EXAM: BP 124/68   Pulse 64   Ht 5\' 10"  (1.778 m)   Wt 233 lb 6.4 oz (105.9 kg)   BMI 33.49 kg/m  General: 72 year old male in no acute distress. Head: Normocephalic and atraumatic. Eyes:  Sclerae non-icteric, conjunctive pink. Ears: Normal auditory acuity. Mouth: Dentition intact. No ulcers or lesions.  Neck: Supple, no lymphadenopathy or thyromegaly.  Lungs: Clear bilaterally to auscultation without wheezes, crackles or rhonchi. Heart: Regular rate and rhythm. No murmur, rub or gallop appreciated.  Abdomen: Soft, nontender, nondistended. No masses. No hepatosplenomegaly. Normoactive bowel sounds x 4 quadrants.  Rectal:  Musculoskeletal: Symmetrical with no gross deformities. Skin: Warm and dry. No rash or lesions on visible extremities. Extremities: No edema. Neurological: Alert oriented x 4, no focal deficits.  Psychological:  Alert and cooperative. Normal mood and affect.  ASSESSMENT AND PLAN:  72 year old male with a personal history of colon polyps, mother and father with history of colon cancer  with a positive FOBT 03/2023.  No obvious rectal bleeding. -CBC -Request past colonoscopy procedure and biopsy results -Colonoscopy benefits and risks discussed including risk with sedation, risk of bleeding, perforation and infection  -Further recommendation to be determined after colonoscopy completed  Remote history of GERD, symptoms abated after he retired.  History of prostate cancer  History of bladder cancer      CC:  Creola Corn, MD

## 2023-07-14 DIAGNOSIS — H524 Presbyopia: Secondary | ICD-10-CM | POA: Diagnosis not present

## 2023-07-14 DIAGNOSIS — H2513 Age-related nuclear cataract, bilateral: Secondary | ICD-10-CM | POA: Diagnosis not present

## 2023-07-15 NOTE — Progress Notes (Signed)
Agree with the assessment and plan as outlined by Alcide Evener, NP.  Plan for colonoscopy given positive FOBT    E. Tomasa Rand, MD [facility]

## 2023-07-16 ENCOUNTER — Encounter: Payer: Medicare Other | Admitting: Gastroenterology

## 2023-07-28 ENCOUNTER — Telehealth: Payer: Self-pay

## 2023-07-28 NOTE — Telephone Encounter (Signed)
Contacted pt & pt verbalized understanding to proceed with colonoscopy per Dr.Cunningham.

## 2023-07-29 ENCOUNTER — Ambulatory Visit: Payer: Medicare Other | Admitting: Gastroenterology

## 2023-07-29 ENCOUNTER — Encounter: Payer: Self-pay | Admitting: Gastroenterology

## 2023-07-29 VITALS — BP 124/66 | HR 72 | Temp 98.6°F | Resp 12 | Ht 70.0 in | Wt 223.0 lb

## 2023-07-29 DIAGNOSIS — Z85038 Personal history of other malignant neoplasm of large intestine: Secondary | ICD-10-CM | POA: Diagnosis not present

## 2023-07-29 DIAGNOSIS — D120 Benign neoplasm of cecum: Secondary | ICD-10-CM

## 2023-07-29 DIAGNOSIS — K635 Polyp of colon: Secondary | ICD-10-CM | POA: Diagnosis not present

## 2023-07-29 DIAGNOSIS — Z08 Encounter for follow-up examination after completed treatment for malignant neoplasm: Secondary | ICD-10-CM

## 2023-07-29 DIAGNOSIS — R195 Other fecal abnormalities: Secondary | ICD-10-CM | POA: Diagnosis not present

## 2023-07-29 MED ORDER — SODIUM CHLORIDE 0.9 % IV SOLN: 500.0000 mL | Freq: Once | INTRAVENOUS | Status: DC

## 2023-07-29 NOTE — Progress Notes (Signed)
Pt's states no medical or surgical changes since previsit or office visit. 

## 2023-07-29 NOTE — Op Note (Signed)
[facility] Patient Name: [name] Procedure Date: 07/29/2023 3:45 PM MRN: [id number] Endoscopist: Lorin Picket E. Tomasa Rand , MD, [id number] Age: 72 Referring MD:  Date of Birth: [birth year] Gender: Male Account #: [id number] Procedure:                Colonoscopy Indications:              Positive Cologuard test Medicines:                Monitored Anesthesia Care Procedure:                Pre-Anesthesia Assessment:                           - Prior to the procedure, a History and Physical                            was performed, and patient medications and                            allergies were reviewed. The patient's tolerance of                            previous anesthesia was also reviewed. The risks                            and benefits of the procedure and the sedation                            options and risks were discussed with the patient.                            All questions were answered, and informed consent                            was obtained. Prior Anticoagulants: The patient has                            taken no anticoagulant or antiplatelet agents. ASA                            Grade Assessment: III - A patient with severe                            systemic disease. After reviewing the risks and                            benefits, the patient was deemed in satisfactory                            condition to undergo the procedure.                           After obtaining informed consent, the colonoscope  was passed under direct vision. Throughout the                            procedure, the patient's blood pressure, pulse, and                            oxygen saturations were monitored continuously. The                            Olympus Scope SN: [id number] was introduced through                            the anus and advanced to the the terminal ileum,                            with identification of the  appendiceal orifice and                            IC valve. The colonoscopy was performed without                            difficulty. The patient tolerated the procedure                            well. The quality of the bowel preparation was                            good. The terminal ileum, ileocecal valve,                            appendiceal orifice, and rectum were photographed.                            The bowel preparation used was SUPREP via split                            dose instruction. Scope In: 4:07:14 PM Scope Out: 4:22:33 PM Scope Withdrawal Time: 0 hours 11 minutes 38 seconds  Total Procedure Duration: 0 hours 15 minutes 19 seconds  Findings:                 The perianal and digital rectal examinations were                            normal. Pertinent negatives include normal                            sphincter tone and no palpable rectal lesions.                           A 2 mm polyp was found in the ileocecal valve. The                            polyp was sessile. The polyp was removed  with a                            cold snare. Resection and retrieval were complete.                            Estimated blood loss was minimal.                           Many medium-mouthed and small-mouthed diverticula                            were found in the sigmoid colon, descending colon                            and transverse colon.                           The exam was otherwise normal throughout the                            examined colon.                           The terminal ileum appeared normal.                           The retroflexed view of the distal rectum and anal                            verge was normal and showed no anal or rectal                            abnormalities. Complications:            No immediate complications. Estimated Blood Loss:     Estimated blood loss was minimal. Impression:               - One 2 mm polyp at the  ileocecal valve, removed                            with a cold snare. Resected and retrieved.                           - Moderate diverticulosis in the sigmoid colon, in                            the descending colon and in the transverse colon.                           - The examined portion of the ileum was normal.                           - The distal rectum and anal verge are normal on  retroflexion view.                           - No abnormalities to explain positive Cologuard;                            suspect false positive Recommendation:           - Patient has a contact number available for                            emergencies. The signs and symptoms of potential                            delayed complications were discussed with the                            patient. Return to normal activities tomorrow.                            Written discharge instructions were provided to the                            patient.                           - Resume previous diet.                           - Continue present medications.                           - Await pathology results.                           - Based on patient's age and lack of high risk                            polyps, I recommend against further colon cancer                            screening/polyp surveillance.                           - Recommend daily fiber supplement to reduce risk                            of diverticular complications. Hilario Robarts E. Tomasa Rand, MD 07/29/2023 4:32:58 PM This report has been signed electronically.

## 2023-07-29 NOTE — Progress Notes (Signed)
History and Physical Interval Note:  07/29/2023 3:56 PM  [name]  has presented today for endoscopic procedure(s), with the diagnosis of  Encounter Diagnosis  Name Primary?   Personal history of colon cancer Yes  .  The various methods of evaluation and treatment have been discussed with the patient and/or family. After consideration of risks, benefits and other options for treatment, the patient has consented to  the endoscopic procedure(s).   The patient's history has been reviewed, patient examined, no change in status, stable for endoscopic procedure(s).  I have reviewed the patient's chart and labs.  Questions were answered to the patient's satisfaction.     Lafe Clerk E. Tomasa Rand, MD [facility]

## 2023-07-29 NOTE — Progress Notes (Signed)
Sedate, gd SR, tolerated procedure well, VSS, report to RN 

## 2023-07-29 NOTE — Progress Notes (Signed)
Called to room to assist during endoscopic procedure.  Patient ID and intended procedure confirmed with present staff. Received instructions for my participation in the procedure from the performing physician.  

## 2023-07-29 NOTE — Patient Instructions (Signed)
 Resume all of your previous medications today as ordered.  Read all of the handouts given to you by your recovery room nurse.  YOU HAD AN ENDOSCOPIC PROCEDURE TODAY AT THE [facility]:   Refer to the procedure report that was given to you for any specific questions about what was found during the examination.  If the procedure report does not answer your questions, please call your gastroenterologist to clarify.  If you requested that your care partner not be given the details of your procedure findings, then the procedure report has been included in a sealed envelope for you to review at your convenience later.  YOU SHOULD EXPECT: Some feelings of bloating in the abdomen. Passage of more gas than usual.  Walking can help get rid of the air that was put into your GI tract during the procedure and reduce the bloating. If you had a lower endoscopy (such as a colonoscopy or flexible sigmoidoscopy) you may notice spotting of blood in your stool or on the toilet paper. If you underwent a bowel prep for your procedure, you may not have a normal bowel movement for a few days.  Please Note:  You might notice some irritation and congestion in your nose or some drainage.  This is from the oxygen used during your procedure.  There is no need for concern and it should clear up in a day or so.  SYMPTOMS TO REPORT IMMEDIATELY:  Following lower endoscopy (colonoscopy or flexible sigmoidoscopy):  Excessive amounts of blood in the stool  Significant tenderness or worsening of abdominal pains  Swelling of the abdomen that is new, acute  Fever of 100F or higher   For urgent or emergent issues, a gastroenterologist can be reached at any hour by calling [phone]. Do not use MyChart messaging for urgent concerns.    DIET:  We do recommend a small meal at first, but then you may proceed to your regular diet.  Drink plenty of fluids but you should avoid alcoholic beverages for 24 hours. Try to eat  a high fiber diet, and drink plenty of water.  ACTIVITY:  You should plan to take it easy for the rest of today and you should NOT DRIVE or use heavy machinery until tomorrow (because of the sedation medicines used during the test).    FOLLOW UP: Our staff will call the number listed on your records the next business day following your procedure.  We will call around 7:15- 8:00 am to check on you and address any questions or concerns that you may have regarding the information given to you following your procedure. If we do not reach you, we will leave a message.     If any biopsies were taken you will be contacted by phone or by letter within the next 1-3 weeks.  Please call us at [phone] if you have not heard about the biopsies in 3 weeks.    SIGNATURES/CONFIDENTIALITY: You and/or your care partner have signed paperwork which will be entered into your electronic medical record.  These signatures attest to the fact that that the information above on your After Visit Summary has been reviewed and is understood.  Full responsibility of the confidentiality of this discharge information lies with you and/or your care-partner.

## 2023-07-30 ENCOUNTER — Telehealth: Payer: Self-pay | Admitting: *Deleted

## 2023-07-30 NOTE — Telephone Encounter (Signed)
  Follow up Call-     07/29/2023    3:14 PM  Call back number  Post procedure Call Back phone  # [phone]  Permission to leave phone message Yes     Patient questions:  Do you have a fever, pain , or abdominal swelling? No. Pain Score  0 *  Have you tolerated food without any problems? Yes.    Have you been able to return to your normal activities? Yes.    Do you have any questions about your discharge instructions: Diet   No. Medications  No. Follow up visit  No.  Do you have questions or concerns about your Care? No.  Actions: * If pain score is 4 or above: No action needed, pain <4.

## 2023-08-04 NOTE — Progress Notes (Signed)
[name],  The polyp removed during your recent colonoscopy was not precancerous.  As previously mentioned, given your lack of polyps and other risk factors for colon cancer, I would recommend against further colon cancer screening for you.

## 2023-08-17 IMAGING — MR MR PROSTATE WO/W CM
12 series · 48 of 48 positions shown · IV contrast (multihance)
Comparison: CT pelvis 05/08/2017

CLINICAL DATA: Elevated PSA level of 5.0 on 11/07/2021. History of
high-grade urothelial carcinoma.

EXAM:
MR PROSTATE WITHOUT AND WITH CONTRAST
TECHNIQUE: Multiplanar multisequence MRI images were obtained of the pelvis
centered about the prostate. Pre and post contrast images were
obtained.
CONTRAST:  20mL MULTIHANCE GADOBENATE DIMEGLUMINE 529 MG/ML IV SOLN

[Series 3: T2 · coronal · 3.0mm · 0.56mm/px · 1 of 23 slices shown (1 of 3)]
[im 1/23]
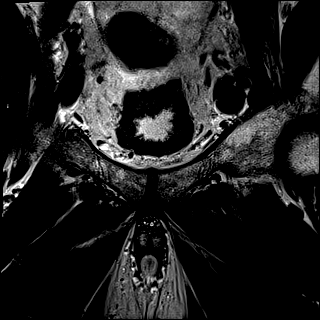

[Series 4: T1 · axial · 5.0mm · 1.25mm/px · z∈[-30,+185]mm · 2 of 88 slices shown]
[im 1/88]
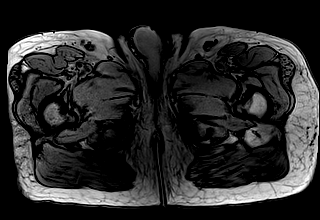
[im 88/88]
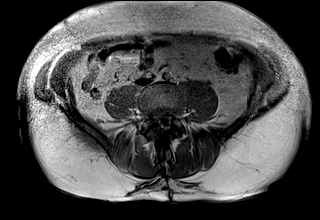

[Series 5: DWI · axial · 3.0mm · 1.75mm/px · z∈[-0,+66]mm · 2 of 69 slices shown (1 of 3)]
[im 1/69]
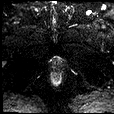
[im 69/69]
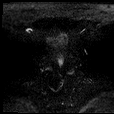

[Series 6: DWI · axial · 3.0mm · 1.75mm/px · 1 of 23 slices shown (2 of 3)]
[im 1/23]
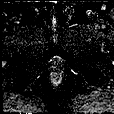

[Series 7: DWI · axial · 3.0mm · 1.75mm/px · 1 of 23 slices shown (3 of 3)]
[im 1/23]
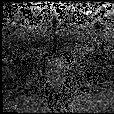

[Series 8: T2 · axial · 3.0mm · 0.56mm/px · 1 of 23 slices shown (2 of 3)]
[im 1/23]
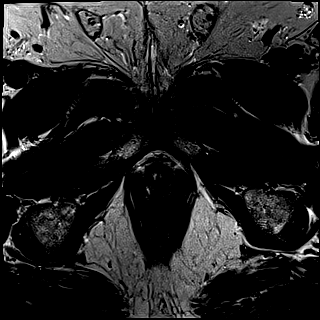

[Series 9: T2 · axial · 1.0mm · 1.04mm/px · z∈[-3,+68]mm · 2 of 72 slices shown (3 of 3)]
[im 1/72]
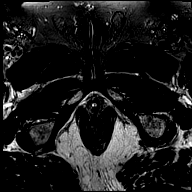
[im 72/72]
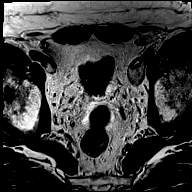

[Series 10: pre t1_twist_tra_dyn · axial · non-contrast · 3.5mm · 0.83mm/px · 1 of 20 slices shown]
[im 1/20]
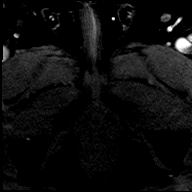

[Series 11: post t1_twist_tra_dyn-copy center · axial · non-contrast · 3.5mm · 0.83mm/px · z∈[-3,+64]mm · 17 of 600 slices shown]
[im 1/600]
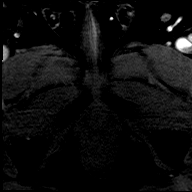
[im 38/600]
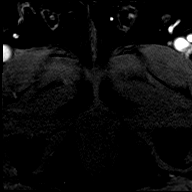
[im 75/600]
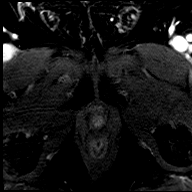
[im 113/600]
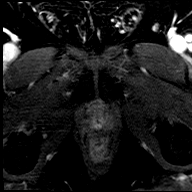
[im 150/600]
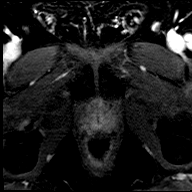
[im 188/600]
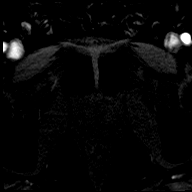
[im 225/600]
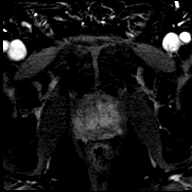
[im 263/600]
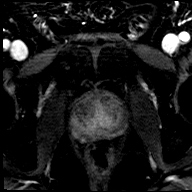
[im 300/600]
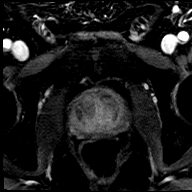
[im 337/600]
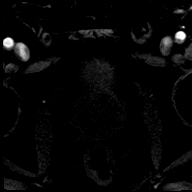
[im 375/600]
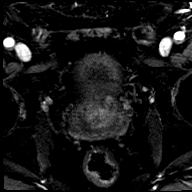
[im 412/600]
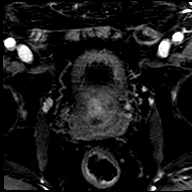
[im 450/600]
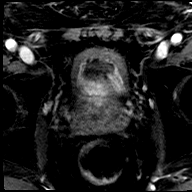
[im 487/600]
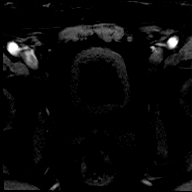
[im 525/600]
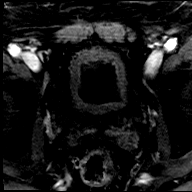
[im 562/600]
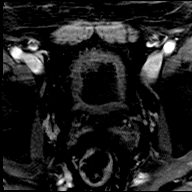
[im 600/600]
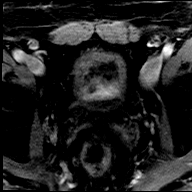

[Series 12: post t1_twist_tra_dyn-copy cent_sub · axial · 3.5mm · 0.83mm/px · z∈[-3,+64]mm · 16 of 576 slices shown]
[im 1/576]
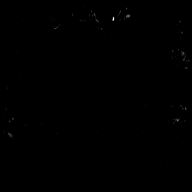
[im 39/576]
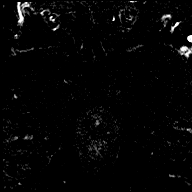
[im 77/576]
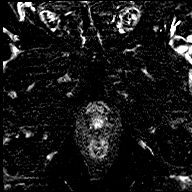
[im 116/576]
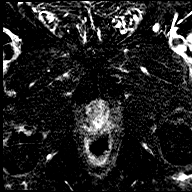
[im 154/576]
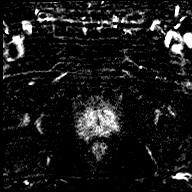
[im 192/576]
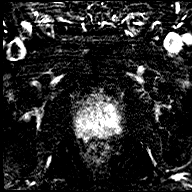
[im 231/576]
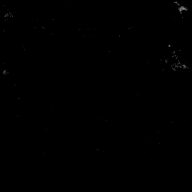
[im 269/576]
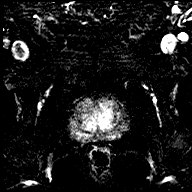
[im 307/576]
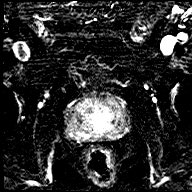
[im 346/576]
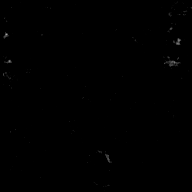
[im 384/576]
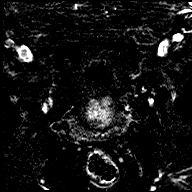
[im 422/576]
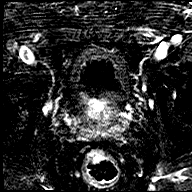
[im 461/576]
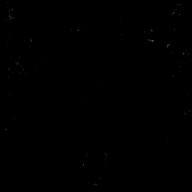
[im 499/576]
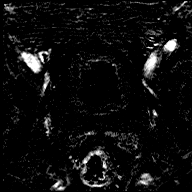
[im 537/576]
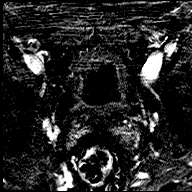
[im 576/576]
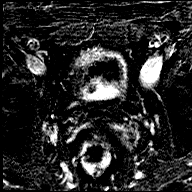

[Series 13: t1_vibe_dixon_tra_f · axial · 2.5mm · 0.91mm/px · z∈[-21,+176]mm · 2 of 80 slices shown]
[im 1/80]
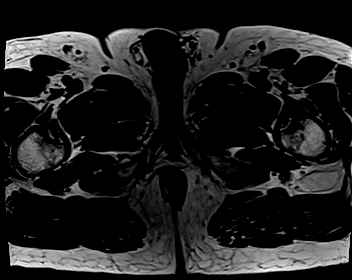
[im 80/80]
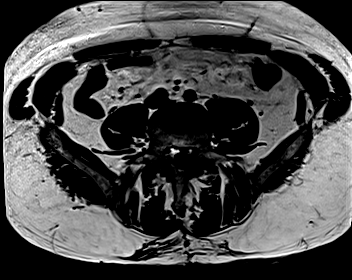

[Series 14: t1_vibe_dixon_tra_w · axial · 2.5mm · 0.91mm/px · z∈[-21,+176]mm · 2 of 80 slices shown]
[im 1/80]
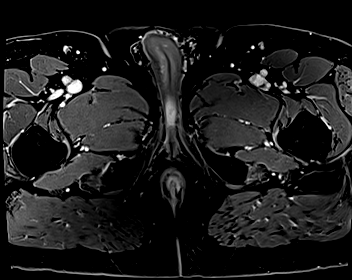
[im 80/80]
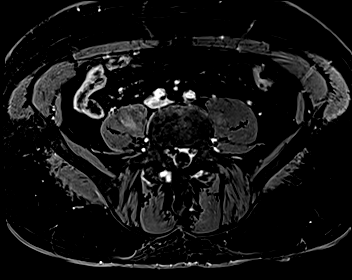

[48 of 48 positions shown; findings below may reference images not displayed]

FINDINGS: Prostate:

Region of interest # 1: PI-RADS category 4 lesion of the right
posteromedial and posterolateral peripheral zone at the base with
focally reduced T2 signal (image 32, series 9) and focal early
enhancement (image 169, series 12). No substantial restricted
diffusion. This measures 0.38 cc (1.2 by 0.7 by 0.7 cm

Region of interest # 2: PI-RADS category 4 lesion of the left
posterolateral and posteromedial peripheral zone at the apex with
focally reduced T2 signal (image 54, series 9) and some mild focal
early enhancement (image 174, series 12). This measures 0.36 cc (1.0
by 0.4 by 1.0 cm).

Region of interest # 3: Small PI-RADS category 3 lesion of the right
posteromedial peripheral zone in the mid gland, with focally reduced
T2 signal (image 44, series 9) but no restricted diffusion or early
enhancement. This measures 0.10 cc (0.6 by 0.4 by 0.4 cm.

Mild encapsulated nodularity in the transition zone compatible with
benign prostatic hypertrophy.

Volume: 3D volumetric analysis: Prostate volume 46.48 cc (5.4 by
by 4.9 cm).

Transcapsular spread:  Absent

Seminal vesicle involvement: Absent

Neurovascular bundle involvement: Absent

Pelvic adenopathy: Absent

Bone metastasis: Absent

Other findings: Nondistended urinary bladder without definite
findings of recurrent bladder mass. There is evidence of
atherosclerotic plaque in the iliac arteries.
IMPRESSION: 1. Two PI-RADS category 4 lesions and a single PI-RADS category 3
lesion of the peripheral zone of the prostate gland. Targeting data
sent to [facility].
2. Benign prostatic hypertrophy and mild prostatomegaly.
3. Atherosclerosis.

## 2023-10-05 DIAGNOSIS — Z8546 Personal history of malignant neoplasm of prostate: Secondary | ICD-10-CM | POA: Diagnosis not present

## 2023-10-05 DIAGNOSIS — C61 Malignant neoplasm of prostate: Secondary | ICD-10-CM | POA: Diagnosis not present

## 2023-10-12 DIAGNOSIS — N401 Enlarged prostate with lower urinary tract symptoms: Secondary | ICD-10-CM | POA: Diagnosis not present

## 2023-10-12 DIAGNOSIS — Z8546 Personal history of malignant neoplasm of prostate: Secondary | ICD-10-CM | POA: Diagnosis not present

## 2023-10-12 DIAGNOSIS — Z8551 Personal history of malignant neoplasm of bladder: Secondary | ICD-10-CM | POA: Diagnosis not present

## 2023-10-12 DIAGNOSIS — R351 Nocturia: Secondary | ICD-10-CM | POA: Diagnosis not present

## 2023-10-19 DIAGNOSIS — R739 Hyperglycemia, unspecified: Secondary | ICD-10-CM | POA: Diagnosis not present

## 2023-10-19 DIAGNOSIS — F419 Anxiety disorder, unspecified: Secondary | ICD-10-CM | POA: Diagnosis not present

## 2023-10-19 DIAGNOSIS — E785 Hyperlipidemia, unspecified: Secondary | ICD-10-CM | POA: Diagnosis not present

## 2023-10-19 DIAGNOSIS — E669 Obesity, unspecified: Secondary | ICD-10-CM | POA: Diagnosis not present

## 2023-10-19 DIAGNOSIS — I2510 Atherosclerotic heart disease of native coronary artery without angina pectoris: Secondary | ICD-10-CM | POA: Diagnosis not present

## 2023-10-19 DIAGNOSIS — I10 Essential (primary) hypertension: Secondary | ICD-10-CM | POA: Diagnosis not present

## 2023-10-19 DIAGNOSIS — E039 Hypothyroidism, unspecified: Secondary | ICD-10-CM | POA: Diagnosis not present

## 2023-10-19 DIAGNOSIS — C4492 Squamous cell carcinoma of skin, unspecified: Secondary | ICD-10-CM | POA: Diagnosis not present

## 2023-10-19 DIAGNOSIS — C61 Malignant neoplasm of prostate: Secondary | ICD-10-CM | POA: Diagnosis not present

## 2023-10-19 DIAGNOSIS — E559 Vitamin D deficiency, unspecified: Secondary | ICD-10-CM | POA: Diagnosis not present

## 2023-10-19 DIAGNOSIS — D414 Neoplasm of uncertain behavior of bladder: Secondary | ICD-10-CM | POA: Diagnosis not present

## 2023-10-19 DIAGNOSIS — R5383 Other fatigue: Secondary | ICD-10-CM | POA: Diagnosis not present

## 2023-10-21 DIAGNOSIS — D044 Carcinoma in situ of skin of scalp and neck: Secondary | ICD-10-CM | POA: Diagnosis not present

## 2023-10-21 DIAGNOSIS — L820 Inflamed seborrheic keratosis: Secondary | ICD-10-CM | POA: Diagnosis not present

## 2023-10-21 DIAGNOSIS — L280 Lichen simplex chronicus: Secondary | ICD-10-CM | POA: Diagnosis not present

## 2023-10-21 DIAGNOSIS — Z85828 Personal history of other malignant neoplasm of skin: Secondary | ICD-10-CM | POA: Diagnosis not present

## 2023-10-21 DIAGNOSIS — L570 Actinic keratosis: Secondary | ICD-10-CM | POA: Diagnosis not present

## 2023-10-21 DIAGNOSIS — D485 Neoplasm of uncertain behavior of skin: Secondary | ICD-10-CM | POA: Diagnosis not present

## 2023-12-15 NOTE — Progress Notes (Signed)
 [facility] [address] [place]. [place] [address] [phone]       OFFICE FOLLOW UP NOTE  [name] Date of Birth:  Dec 02, 1951 Medical Record Number:  [id number]   Primary neurologist: Dr. Buck Reason for visit: CPAP follow-up    SUBJECTIVE:   CHIEF COMPLAINT:  Chief Complaint  Patient presents with   Follow-up    Patient in room #8 and alone. Patient states he is concern that he can sleep for 8 til 10 hours, then get up and eat breakfast, he feels like he could go back to sleep. Patient states he still feel tried due the day.    Follow-up visit:  Prior visit: 12/10/2022   Brief HPI:   [name] is a 73 y.o. male who is followed for OSA on CPAP since 2016.  Sleep study in 2016 showed moderate to severe sleep apnea with total AHI 35.7/h and O2 nadir of 86%, also noted severe PLMS at 93.7/h with an associated arousal rate of 5.3/h.  CPAP titrated from 5 to 13 with eventual residual AHI 0, noted improvement of PLMS with minimal arousals and improvement of sleep efficiency.  CPAP initiated 05/2015.  He received a new CPAP machine in 09/2021.  At prior visit, noted excellent CPAP compliance with optimal residual AHI.  Continued to do well with CPAP.  Noted increased fatigue but felt d/t dx of prostate cancer s/p radioactive seed implant in 05/2023.  ESS 8/24.     Interval history:  Compliance report as below showing continued excellent usage and optimal residual AHI.  Continues to tolerate CPAP well. Main concern is increased sleepiness sensation over the past 6 months. ESS 16/24 (prior 8/24). Does not feel refreshed in the morning, will eat breakfast and usually needs to lay down to take a nap. He has been drinking about 6-7 cups of coffee per day just to try to get going but usually doesn't help. He was previously very active and enjoyed exercising but now feels like he needs to force himself just to take a walk. PCP did recently increase  Synthroid dose but has not noticed any difference.  He has a follow-up for repeat labs at the end of this month.  He routinely takes vitamin D 12 and vitamin D.  On Lexapro 10 mg for depression/anxiety, believes this is currently well-controlled. Does have nocturia about 2-3 times per night but otherwise sleeps throughout the night. Up to date on supplies, DME [employer].         ROS:   14 system review of systems performed and negative with exception of those listed in HPI  PMH:  Past Medical History:  Diagnosis Date   ADHD (attention deficit hyperactivity disorder)    stopped taking concerta 3 weeks ago due to shortage, seems to be doing ok off of it.05/30/2022   Anxiety    Benign localized prostatic hyperplasia with lower urinary tract symptoms (LUTS)    Bladder cancer (HCC) urolgoist-  dr watt   COVID    had it 2 yrs ago body aches and nasal congestion for 1 week all s/s resolved 05/30/2022   History of adenomatous polyp of colon    2002  tubular adenoma   HOH (hard of hearing)    Hyperlipidemia    Hypertension    Hypothyroidism    OAB (overactive bladder)    OSA on CPAP    severe osa per study 05/ 2016   Wears glasses  PSH:  Past Surgical History:  Procedure Laterality Date   APPENDECTOMY     CYSTOSCOPY WITH RETROGRADE PYELOGRAM, URETEROSCOPY AND STENT PLACEMENT Right 07/07/2017   Procedure: CYSTOSCOPY;  Surgeon: Watt Rush, MD;  Location: [facility];  Service: Urology;  Laterality: Right;   CYSTOSCOPY WITH STENT PLACEMENT Right 05/28/2017   Procedure: CYSTOSCOPY;  Surgeon: Watt Rush, MD;  Location: [facility];  Service: Urology;  Laterality: Right;   KNEE ARTHROSCOPY Right 07/17/2021   w mcl repair   LUMBAR MICRODISCECTOMY  05-28-2000;  12-09-2002   L4--5 (05-28-2000)/  L5--S1 (12-09-2002)   RADIOACTIVE SEED IMPLANT N/A 06/06/2022   Procedure: RADIOACTIVE SEED IMPLANT/BRACHYTHERAPY IMPLANT;  Surgeon: Watt Rush, MD;   Location: [facility];  Service: Urology;  Laterality: N/A;   RHINOPLASTY     SPACE OAR INSTILLATION N/A 06/06/2022   Procedure: SPACE OAR INSTILLATION;  Surgeon: Watt Rush, MD;  Location: [facility];  Service: Urology;  Laterality: N/A;   TOTAL HIP ARTHROPLASTY Right 07/17/2021   TRANSURETHRAL RESECTION OF BLADDER TUMOR N/A 05/28/2017   Procedure: TRANSURETHRAL RESECTION OF BLADDER TUMOR (TURBT);  Surgeon: Watt Rush, MD;  Location: [facility];  Service: Urology;  Laterality: N/A;   TRANSURETHRAL RESECTION OF BLADDER TUMOR N/A 07/07/2017   Procedure: RESTAGING TRANSURETHRAL RESECTION OF BLADDER TUMOR (TURBT);  Surgeon: Watt Rush, MD;  Location: [facility];  Service: Urology;  Laterality: N/A;    Social History:  Social History   Socioeconomic History   Marital status: Married    Spouse name: Not on file   Number of children: 1   Years of education: BS    Highest education level: Not on file  Occupational History   Occupation: Retired  Tobacco Use   Smoking status: Former    Current packs/day: 0.00    Types: Cigarettes    Quit date: 05/20/1997    Years since quitting: 26.5   Smokeless tobacco: Never   Tobacco comments:    Quit in 1980' s  Vaping Use   Vaping status: Never Used  Substance and Sexual Activity   Alcohol  use: Yes    Alcohol /week: 2.0 standard drinks of alcohol     Types: 2 Cans of beer per week   Drug use: No   Sexual activity: Not on file  Other Topics Concern   Not on file  Social History Narrative   Daily consumes coffee, tea, and some soda    Social Drivers of Corporate Investment Banker Strain: Not on file  Food Insecurity: Not on file  Transportation Needs: Not on file  Physical Activity: Not on file  Stress: Not on file  Social Connections: Not on file  Intimate Partner Violence: Not on file    Family History:  Family History  Problem Relation Age of Onset   Colon cancer  Mother 21   Heart murmur Mother    Thyroid  cancer Mother        dx. 40s   Colon cancer Father 72   Diabetes Father    Lymphoma Brother    Cancer Maternal Aunt        unspecified intestinal cancer   Throat cancer Maternal Uncle    Stomach cancer Maternal Grandmother        dx. 40s   Esophageal cancer Neg Hx    Heart disease Neg Hx    Rectal cancer Neg Hx     Medications:   Current Outpatient Medications on File Prior to Visit  Medication Sig Dispense Refill   aspirin 81 MG tablet Take 81 mg by mouth daily. Stopped that on 05/25/2022     ATORVASTATIN CALCIUM PO Take 40 mg by mouth every morning.      cholecalciferol (VITAMIN D) 1000 UNITS tablet Take 2,000 Units by mouth daily. Stopped on 05/26/2022     escitalopram (LEXAPRO) 20 MG tablet Take 10 mg by mouth every morning. Pt taking 1/2 tab daily     Levothyroxine Sodium (SYNTHROID PO) Take 150 mcg by mouth every morning. On Saturday's takes 1.5 tablets     lisinopril-hydrochlorothiazide (PRINZIDE,ZESTORETIC) 20-12.5 MG per tablet Take 1 tablet by mouth every morning.      Multiple Vitamin (MULTIVITAMIN) capsule Take 1 capsule by mouth daily. Stopped taking that on 05/26/2022     Naproxen Sodium (ALEVE) 220 MG CAPS Take 2 capsules by mouth 2 (two) times daily.     OVER THE COUNTER MEDICATION Magnesium glycinate 240mg  : one daily     OVER THE COUNTER MEDICATION Super C with D3 and Zinc 900mg : one daily     silodosin (RAPAFLO) 8 MG CAPS capsule Take 8 mg by mouth daily with breakfast.     vitamin B-12 (CYANOCOBALAMIN) 100 MCG tablet Take 100 mcg by mouth daily. Stopped taking that on 05/26/2022     No current facility-administered medications on file prior to visit.    Allergies:  No Known Allergies    OBJECTIVE:  Physical Exam  Vitals:   12/16/23 1307  BP: 134/77  Pulse: 76  Weight: 256 lb (116.1 kg)  Height: 5' 11 (1.803 m)   Body mass index is 35.7 kg/m. No results found.   General: well developed, well  nourished, very pleasant elderly Caucasian male, seated, in no evident distress  Neurologic Exam Mental Status: Awake and fully alert. Oriented to place and time. Recent and remote memory intact. Attention span, concentration and fund of knowledge appropriate. Mood and affect appropriate.  Cranial Nerves: Pupils equal, briskly reactive to light. Extraocular movements full without nystagmus. Visual fields full to confrontation. Hearing intact. Facial sensation intact. Face, tongue, palate moves normally and symmetrically.  Motor: Normal bulk and tone. Normal strength in all tested extremity muscles Gait and Station: Arises from chair without difficulty. Stance is normal. Gait demonstrates normal stride length and balance without use of AD.  Reflexes: 1+ and symmetric. Toes downgoing.         ASSESSMENT/PLAN: [name] is a 73 y.o. year old male    OSA on CPAP : Compliance report shows satisfactory usage with optimal residual AHI.  Continue CPAP pressure of 13 with EPR 2.  Discussed continued nightly usage with ensuring greater than 4 hours nightly for optimal benefit and per insurance purposes.  Continue to follow with DME company for any needed supplies or CPAP related concerns  Excessive daytime sleepiness: Suspect multifactoral such as hypothyroidism (with recent adjustment to Synthroid), age, and being sedentary. Has f/u with PCP end of month for repeat TSH, recommend recheck B12 and Vit D level as well. Discussed gradually increasing routine physical activity and exercise as well as maintaining a healthy diet.     Follow up in 1 year or call earlier if needed   CC:  PCP: Eric Rush, MD    I spent 30 minutes of face-to-face and non-face-to-face time with patient.  This included previsit chart review, lab review, study review, order entry, electronic health record documentation, patient education and discussion regarding above diagnoses and treatment plan and answered  all  other questions to patient's satisfaction  Harlene Bogaert, [facility]  [facility] [address] [place], [place] [address]  Phone [phone] Fax [phone] Note: This document was prepared with digital dictation and possible smart phrase technology. Any transcriptional errors that result from this process are unintentional.

## 2023-12-16 ENCOUNTER — Encounter: Payer: Self-pay | Admitting: Adult Health

## 2023-12-16 ENCOUNTER — Ambulatory Visit: Payer: Medicare Other | Admitting: Adult Health

## 2023-12-16 VITALS — BP 134/77 | HR 76 | Ht 71.0 in | Wt 256.0 lb

## 2023-12-16 DIAGNOSIS — G4733 Obstructive sleep apnea (adult) (pediatric): Secondary | ICD-10-CM

## 2023-12-16 DIAGNOSIS — G4719 Other hypersomnia: Secondary | ICD-10-CM | POA: Diagnosis not present

## 2023-12-16 NOTE — Patient Instructions (Addendum)
 Your Plan:  Continue nightly use of CPAP for adequate sleep apnea management   Continue to follow with your DME company [employer] for any needed supplies or CPAP related concerns  You can try a CPAP pillow which can help you sleep on your side better  Ensure recheck of thyroid  at follow up visit with your PCP as well as B12 and Vit D as this can contribute to sleepiness      Follow up in 1 year or call earlier if needed      Thank you for coming to see us  at [facility]. I hope we have been able to provide you high quality care today.  You may receive a patient satisfaction survey over the next few weeks. We would appreciate your feedback and comments so that we may continue to improve ourselves and the health of our patients.

## 2023-12-16 NOTE — Addendum Note (Signed)
 Addended by: Ihor Austin L on: 12/16/2023 01:42 PM   Modules accepted: Orders

## 2023-12-21 DIAGNOSIS — M1712 Unilateral primary osteoarthritis, left knee: Secondary | ICD-10-CM | POA: Diagnosis not present

## 2023-12-21 DIAGNOSIS — M1711 Unilateral primary osteoarthritis, right knee: Secondary | ICD-10-CM | POA: Diagnosis not present

## 2024-01-07 DIAGNOSIS — M1712 Unilateral primary osteoarthritis, left knee: Secondary | ICD-10-CM | POA: Diagnosis not present

## 2024-01-11 DIAGNOSIS — D225 Melanocytic nevi of trunk: Secondary | ICD-10-CM | POA: Diagnosis not present

## 2024-01-11 DIAGNOSIS — L570 Actinic keratosis: Secondary | ICD-10-CM | POA: Diagnosis not present

## 2024-01-11 DIAGNOSIS — L905 Scar conditions and fibrosis of skin: Secondary | ICD-10-CM | POA: Diagnosis not present

## 2024-01-11 DIAGNOSIS — Z85828 Personal history of other malignant neoplasm of skin: Secondary | ICD-10-CM | POA: Diagnosis not present

## 2024-01-13 DIAGNOSIS — M1712 Unilateral primary osteoarthritis, left knee: Secondary | ICD-10-CM | POA: Diagnosis not present

## 2024-01-21 DIAGNOSIS — M1712 Unilateral primary osteoarthritis, left knee: Secondary | ICD-10-CM | POA: Diagnosis not present

## 2024-04-04 DIAGNOSIS — R5383 Other fatigue: Secondary | ICD-10-CM | POA: Diagnosis not present

## 2024-04-04 DIAGNOSIS — R351 Nocturia: Secondary | ICD-10-CM | POA: Diagnosis not present

## 2024-04-04 DIAGNOSIS — E559 Vitamin D deficiency, unspecified: Secondary | ICD-10-CM | POA: Diagnosis not present

## 2024-04-04 DIAGNOSIS — E039 Hypothyroidism, unspecified: Secondary | ICD-10-CM | POA: Diagnosis not present

## 2024-04-04 DIAGNOSIS — Z1212 Encounter for screening for malignant neoplasm of rectum: Secondary | ICD-10-CM | POA: Diagnosis not present

## 2024-04-04 DIAGNOSIS — E785 Hyperlipidemia, unspecified: Secondary | ICD-10-CM | POA: Diagnosis not present

## 2024-04-04 DIAGNOSIS — R739 Hyperglycemia, unspecified: Secondary | ICD-10-CM | POA: Diagnosis not present

## 2024-04-06 DIAGNOSIS — Z8546 Personal history of malignant neoplasm of prostate: Secondary | ICD-10-CM | POA: Diagnosis not present

## 2024-04-11 DIAGNOSIS — Z0000 Encounter for general adult medical examination without abnormal findings: Secondary | ICD-10-CM | POA: Diagnosis not present

## 2024-04-11 DIAGNOSIS — D414 Neoplasm of uncertain behavior of bladder: Secondary | ICD-10-CM | POA: Diagnosis not present

## 2024-04-11 DIAGNOSIS — R5383 Other fatigue: Secondary | ICD-10-CM | POA: Diagnosis not present

## 2024-04-11 DIAGNOSIS — M25561 Pain in right knee: Secondary | ICD-10-CM | POA: Diagnosis not present

## 2024-04-11 DIAGNOSIS — Z1331 Encounter for screening for depression: Secondary | ICD-10-CM | POA: Diagnosis not present

## 2024-04-11 DIAGNOSIS — E669 Obesity, unspecified: Secondary | ICD-10-CM | POA: Diagnosis not present

## 2024-04-11 DIAGNOSIS — I10 Essential (primary) hypertension: Secondary | ICD-10-CM | POA: Diagnosis not present

## 2024-04-11 DIAGNOSIS — E039 Hypothyroidism, unspecified: Secondary | ICD-10-CM | POA: Diagnosis not present

## 2024-04-11 DIAGNOSIS — F419 Anxiety disorder, unspecified: Secondary | ICD-10-CM | POA: Diagnosis not present

## 2024-04-11 DIAGNOSIS — E785 Hyperlipidemia, unspecified: Secondary | ICD-10-CM | POA: Diagnosis not present

## 2024-04-11 DIAGNOSIS — E559 Vitamin D deficiency, unspecified: Secondary | ICD-10-CM | POA: Diagnosis not present

## 2024-04-11 DIAGNOSIS — Z1389 Encounter for screening for other disorder: Secondary | ICD-10-CM | POA: Diagnosis not present

## 2024-04-11 DIAGNOSIS — C61 Malignant neoplasm of prostate: Secondary | ICD-10-CM | POA: Diagnosis not present

## 2024-04-11 DIAGNOSIS — R82998 Other abnormal findings in urine: Secondary | ICD-10-CM | POA: Diagnosis not present

## 2024-04-11 DIAGNOSIS — G4733 Obstructive sleep apnea (adult) (pediatric): Secondary | ICD-10-CM | POA: Diagnosis not present

## 2024-04-13 DIAGNOSIS — Z8546 Personal history of malignant neoplasm of prostate: Secondary | ICD-10-CM | POA: Diagnosis not present

## 2024-04-13 DIAGNOSIS — N401 Enlarged prostate with lower urinary tract symptoms: Secondary | ICD-10-CM | POA: Diagnosis not present

## 2024-04-13 DIAGNOSIS — Z8551 Personal history of malignant neoplasm of bladder: Secondary | ICD-10-CM | POA: Diagnosis not present

## 2024-04-13 DIAGNOSIS — R351 Nocturia: Secondary | ICD-10-CM | POA: Diagnosis not present

## 2024-05-17 DIAGNOSIS — D485 Neoplasm of uncertain behavior of skin: Secondary | ICD-10-CM | POA: Diagnosis not present

## 2024-05-17 DIAGNOSIS — Z85828 Personal history of other malignant neoplasm of skin: Secondary | ICD-10-CM | POA: Diagnosis not present

## 2024-05-17 DIAGNOSIS — L820 Inflamed seborrheic keratosis: Secondary | ICD-10-CM | POA: Diagnosis not present

## 2024-05-17 DIAGNOSIS — L570 Actinic keratosis: Secondary | ICD-10-CM | POA: Diagnosis not present

## 2024-07-26 DIAGNOSIS — H43812 Vitreous degeneration, left eye: Secondary | ICD-10-CM | POA: Diagnosis not present

## 2024-07-26 DIAGNOSIS — H2513 Age-related nuclear cataract, bilateral: Secondary | ICD-10-CM | POA: Diagnosis not present

## 2024-07-26 DIAGNOSIS — H5203 Hypermetropia, bilateral: Secondary | ICD-10-CM | POA: Diagnosis not present

## 2024-09-29 DIAGNOSIS — M1712 Unilateral primary osteoarthritis, left knee: Secondary | ICD-10-CM | POA: Diagnosis not present

## 2024-10-06 DIAGNOSIS — M1712 Unilateral primary osteoarthritis, left knee: Secondary | ICD-10-CM | POA: Diagnosis not present

## 2024-10-12 DIAGNOSIS — Z23 Encounter for immunization: Secondary | ICD-10-CM | POA: Diagnosis not present

## 2024-10-12 DIAGNOSIS — Z8546 Personal history of malignant neoplasm of prostate: Secondary | ICD-10-CM | POA: Diagnosis not present

## 2024-10-12 DIAGNOSIS — I10 Essential (primary) hypertension: Secondary | ICD-10-CM | POA: Diagnosis not present

## 2024-10-12 DIAGNOSIS — I2510 Atherosclerotic heart disease of native coronary artery without angina pectoris: Secondary | ICD-10-CM | POA: Diagnosis not present

## 2024-10-12 DIAGNOSIS — E039 Hypothyroidism, unspecified: Secondary | ICD-10-CM | POA: Diagnosis not present

## 2024-10-12 DIAGNOSIS — D414 Neoplasm of uncertain behavior of bladder: Secondary | ICD-10-CM | POA: Diagnosis not present

## 2024-10-12 DIAGNOSIS — C4492 Squamous cell carcinoma of skin, unspecified: Secondary | ICD-10-CM | POA: Diagnosis not present

## 2024-10-12 DIAGNOSIS — E669 Obesity, unspecified: Secondary | ICD-10-CM | POA: Diagnosis not present

## 2024-10-12 DIAGNOSIS — F419 Anxiety disorder, unspecified: Secondary | ICD-10-CM | POA: Diagnosis not present

## 2024-10-12 DIAGNOSIS — E785 Hyperlipidemia, unspecified: Secondary | ICD-10-CM | POA: Diagnosis not present

## 2024-10-12 DIAGNOSIS — R972 Elevated prostate specific antigen [PSA]: Secondary | ICD-10-CM | POA: Diagnosis not present

## 2024-10-12 DIAGNOSIS — F900 Attention-deficit hyperactivity disorder, predominantly inattentive type: Secondary | ICD-10-CM | POA: Diagnosis not present

## 2024-10-13 DIAGNOSIS — M1712 Unilateral primary osteoarthritis, left knee: Secondary | ICD-10-CM | POA: Diagnosis not present

## 2024-12-14 NOTE — Progress Notes (Signed)
 " [facility] [address] [place]. [place] [address] [phone]       OFFICE FOLLOW UP NOTE  [name] Date of Birth:  [birth year] Medical Record Number:  [id number]   Primary neurologist: Dr. Buck Reason for visit: CPAP follow-up    SUBJECTIVE:   CHIEF COMPLAINT:  Chief Complaint  Patient presents with   RM 8     Patient is here alone for CPAP follow-up - no issues with machine or mask  ESS - 9    Follow-up visit:  Prior visit: 12/16/2023  Brief HPI:   [name] is a 74 y.o. male who is followed for OSA on CPAP since 2016.  Sleep study in 2016 showed moderate to severe sleep apnea with total AHI 35.7/h and O2 nadir of 86%, also noted severe PLMS at 93.7/h with an associated arousal rate of 5.3/h.  CPAP titrated from 5 to 13 with eventual residual AHI 0, noted improvement of PLMS with minimal arousals and improvement of sleep efficiency.  CPAP initiated 05/2015.  He received a new CPAP machine in 09/2021.  At prior visit, noted excellent CPAP compliance with optimal residual AHI.  Continued to do well with CPAP.  Noted increased sleepiness over the past 44-month duration, ESS 16/24, plan on follow-up with PCP for repeat lab work including recent abnormal thyroid  level and recheck of vitamin deficiencies.     Interval history:  Patient returns for follow-up visit.  Reports overall doing well with CPAP therapy and notes continued benefit.  He does report some improvement of daytime energy levels since prior visit.  ESS 9/24 showing improvement compared to prior visit at 16/24.  He has been trying to increase daily activity and exercise as well as modifying diet, he has lost approximately 40 pounds since prior visit.  Continues to follow with DME [employer], up-to-date on supplies.  No questions or concerns at this time.         ROS:   14 system review of systems performed and negative with exception of those listed in HPI  PMH:   Past Medical History:  Diagnosis Date   ADHD (attention deficit hyperactivity disorder)    stopped taking concerta 3 weeks ago due to shortage, seems to be doing ok off of it.05/30/2022   Anxiety    Benign localized prostatic hyperplasia with lower urinary tract symptoms (LUTS)    Bladder cancer (HCC) urolgoist-  dr watt   COVID    had it 2 yrs ago body aches and nasal congestion for 1 week all s/s resolved 05/30/2022   History of adenomatous polyp of colon    2002  tubular adenoma   HOH (hard of hearing)    Hyperlipidemia    Hypertension    Hypothyroidism    OAB (overactive bladder)    OSA on CPAP    severe osa per study 05/ 2016   Wears glasses     PSH:  Past Surgical History:  Procedure Laterality Date   APPENDECTOMY     CYSTOSCOPY WITH RETROGRADE PYELOGRAM, URETEROSCOPY AND STENT PLACEMENT Right 07/07/2017   Procedure: CYSTOSCOPY;  Surgeon: Watt Rush, MD;  Location: [facility];  Service: Urology;  Laterality: Right;   CYSTOSCOPY WITH STENT PLACEMENT Right 05/28/2017   Procedure: CYSTOSCOPY;  Surgeon: Watt Rush, MD;  Location: [facility];  Service: Urology;  Laterality: Right;   KNEE ARTHROSCOPY Right 07/17/2021   w mcl repair   LUMBAR MICRODISCECTOMY  05-28-2000;  12-09-2002  L4--5 (05-28-2000)/  L5--S1 (12-09-2002)   RADIOACTIVE SEED IMPLANT N/A 06/06/2022   Procedure: RADIOACTIVE SEED IMPLANT/BRACHYTHERAPY IMPLANT;  Surgeon: Watt Rush, MD;  Location: [facility];  Service: Urology;  Laterality: N/A;   RHINOPLASTY     SPACE OAR INSTILLATION N/A 06/06/2022   Procedure: SPACE OAR INSTILLATION;  Surgeon: Watt Rush, MD;  [facility];  Service: Urology;  Laterality: N/A;   TOTAL HIP ARTHROPLASTY Right 07/17/2021   TRANSURETHRAL RESECTION OF BLADDER TUMOR N/A 05/28/2017   Procedure: TRANSURETHRAL RESECTION OF BLADDER TUMOR (TURBT);  Surgeon: Watt Rush, MD;  [facility];   Service: Urology;  Laterality: N/A;   TRANSURETHRAL RESECTION OF BLADDER TUMOR N/A 07/07/2017   Procedure: RESTAGING TRANSURETHRAL RESECTION OF BLADDER TUMOR (TURBT);  Surgeon: Watt Rush, MD;  Location: [facility];  Service: Urology;  Laterality: N/A;    Social History:  Social History   Socioeconomic History   Marital status: Married    Spouse name: Not on file   Number of children: 1   Years of education: BS    Highest education level: Not on file  Occupational History   Occupation: Retired  Tobacco Use   Smoking status: Former    Current packs/day: 0.00    Types: Cigarettes    Quit date: 05/20/1997    Years since quitting: 27.5   Smokeless tobacco: Never   Tobacco comments:    Quit in 1980' s  Vaping Use   Vaping status: Never Used  Substance and Sexual Activity   Alcohol  use: Yes    Alcohol /week: 2.0 standard drinks of alcohol     Types: 2 Cans of beer per week   Drug use: No   Sexual activity: Not on file  Other Topics Concern   Not on file  Social History Narrative   Daily consumes coffee, no tea, and  no soda    Social Drivers of Health   Tobacco Use: Medium Risk (12/16/2023)   Patient History    Smoking Tobacco Use: Former    Smokeless Tobacco Use: Never    Passive Exposure: Not on Actuary Strain: Not on file  Food Insecurity: Not on file  Transportation Needs: Not on file  Physical Activity: Not on file  Stress: Not on file  Social Connections: Not on file  Intimate Partner Violence: Not on file  Depression (PHQ2-9): Not on file  Alcohol  Screen: Not on file  Housing: Not on file  Utilities: Not on file  Health Literacy: Not on file    Family History:  Family History  Problem Relation Age of Onset   Colon cancer Mother 68   Heart murmur Mother    Thyroid  cancer Mother        dx. 40s   Sleep apnea Father    Stroke Father    Colon cancer Father 53   Diabetes Father    Lymphoma Brother    Stomach cancer  Maternal Grandmother        dx. 40s   Cancer Maternal Aunt        unspecified intestinal cancer   Throat cancer Maternal Uncle    Esophageal cancer Neg Hx    Heart disease Neg Hx    Rectal cancer Neg Hx    Migraines Neg Hx    Seizures Neg Hx     Medications:   Current Outpatient Medications on File Prior to Visit  Medication Sig Dispense Refill   aspirin 81 MG tablet Take  81 mg by mouth daily. Stopped that on 05/25/2022     ATORVASTATIN CALCIUM PO Take 40 mg by mouth every morning.      cholecalciferol (VITAMIN D) 1000 UNITS tablet Take 2,000 Units by mouth daily. Stopped on 05/26/2022     escitalopram (LEXAPRO) 20 MG tablet Take 10 mg by mouth every morning. Pt taking 1/2 tab daily     Levothyroxine Sodium (SYNTHROID PO) Take 150 mcg by mouth every morning. On Saturday's takes 1.5 tablets     lisinopril-hydrochlorothiazide (PRINZIDE,ZESTORETIC) 20-12.5 MG per tablet Take 1 tablet by mouth every morning.      Multiple Vitamin (MULTIVITAMIN) capsule Take 1 capsule by mouth daily. Stopped taking that on 05/26/2022     OVER THE COUNTER MEDICATION Magnesium glycinate 240mg  : one daily     OVER THE COUNTER MEDICATION Super C with D3 and Zinc 900mg : one daily     silodosin (RAPAFLO) 8 MG CAPS capsule Take 8 mg by mouth daily with breakfast.     vitamin B-12 (CYANOCOBALAMIN) 100 MCG tablet Take 100 mcg by mouth daily. Stopped taking that on 05/26/2022     Naproxen Sodium (ALEVE) 220 MG CAPS Take 2 capsules by mouth 2 (two) times daily. (Patient not taking: Reported on 12/15/2024)     No current facility-administered medications on file prior to visit.    Allergies:  No Known Allergies    OBJECTIVE:  Physical Exam  Vitals:   12/15/24 1352  BP: 128/88  Pulse: 65  SpO2: 98%  Weight: 214 lb (97.1 kg)  Height: 5' 9 (1.753 m)   Body mass index is 31.6 kg/m. No results found.  General: well developed, well nourished, very pleasant elderly Caucasian male, seated, in no evident  distress  Neurologic Exam Mental Status: Awake and fully alert. Oriented to place and time. Recent and remote memory intact. Attention span, concentration and fund of knowledge appropriate. Mood and affect appropriate.  Cranial Nerves: Pupils equal, briskly reactive to light. Extraocular movements full without nystagmus. Visual fields full to confrontation. Hearing intact. Facial sensation intact. Face, tongue, palate moves normally and symmetrically.  Motor: Normal bulk and tone. Normal strength in all tested extremity muscles Gait and Station: Arises from chair without difficulty. Stance is normal. Gait demonstrates normal stride length and balance without use of AD.         ASSESSMENT/PLAN: [name] is a 74 y.o. year old male    OSA on CPAP :  Compliance report shows satisfactory usage with optimal residual AHI.   Continue CPAP pressure of 13 with EPR 2.   Discussed continued nightly usage with ensuring greater than 4 hours nightly for optimal benefit and per insurance purposes.   Continue to follow with DME company [employer] for any needed supplies or CPAP related concerns CPAP set up 09/2021, due to new machine 09/2026     Follow up in 1 year or call earlier if needed   CC:  PCP: Onita Rush, MD     Harlene Bogaert, AGNP-BC  [facility] [address] [place], [place] [address]  Phone [phone] Fax [phone] Note: This document was prepared with digital dictation and possible smart phrase technology. Any transcriptional errors that result from this process are unintentional.      "

## 2024-12-15 ENCOUNTER — Ambulatory Visit: Payer: Medicare Other | Admitting: Adult Health

## 2024-12-15 ENCOUNTER — Encounter: Payer: Self-pay | Admitting: Adult Health

## 2024-12-15 VITALS — BP 128/88 | HR 65 | Ht 69.0 in | Wt 214.0 lb

## 2024-12-15 DIAGNOSIS — G4733 Obstructive sleep apnea (adult) (pediatric): Secondary | ICD-10-CM

## 2024-12-15 NOTE — Patient Instructions (Addendum)
 Your Plan:  Continue nightly use of CPAP for adequate sleep apnea management   Continue to follow with DME [employer] for any needed supplies or CPAP related concerns      Follow up in 1 year or call earlier if needed     Thank you for coming to see [place] at [facility]. I hope we have been able to provide you high quality care today.  You may receive a patient satisfaction survey over the next few weeks. We would appreciate your feedback and comments so that we may continue to improve ourselves and the health of our patients.

## 2024-12-15 NOTE — Progress Notes (Signed)
 SABRA

## 2025-12-18 ENCOUNTER — Ambulatory Visit: Admitting: Adult Health
